# Patient Record
Sex: Male | Born: 1965 | Race: Black or African American | Hispanic: No | Smoking: Never smoker
Health system: Southern US, Community
[De-identification: ages and names within clinical notes are randomized; demographics above are authoritative.]

## PROBLEM LIST (undated history)

## (undated) DIAGNOSIS — Z95810 Presence of automatic (implantable) cardiac defibrillator: Secondary | ICD-10-CM

## (undated) DIAGNOSIS — G43909 Migraine, unspecified, not intractable, without status migrainosus: Secondary | ICD-10-CM

## (undated) DIAGNOSIS — I509 Heart failure, unspecified: Secondary | ICD-10-CM

## (undated) DIAGNOSIS — F32A Depression, unspecified: Secondary | ICD-10-CM

## (undated) DIAGNOSIS — F329 Major depressive disorder, single episode, unspecified: Secondary | ICD-10-CM

## (undated) DIAGNOSIS — F419 Anxiety disorder, unspecified: Secondary | ICD-10-CM

## (undated) DIAGNOSIS — E859 Amyloidosis, unspecified: Secondary | ICD-10-CM

## (undated) HISTORY — DX: Anxiety disorder, unspecified: F41.9

## (undated) HISTORY — DX: Migraine, unspecified, not intractable, without status migrainosus: G43.909

## (undated) HISTORY — DX: Major depressive disorder, single episode, unspecified: F32.9

## (undated) HISTORY — DX: Heart failure, unspecified: I50.9

## (undated) HISTORY — PX: HERNIA REPAIR: SHX51

## (undated) HISTORY — DX: Presence of automatic (implantable) cardiac defibrillator: Z95.810

## (undated) HISTORY — DX: Depression, unspecified: F32.A

## (undated) HISTORY — DX: Amyloidosis, unspecified: E85.9

---

## 2011-11-13 HISTORY — PX: HEART TRANSPLANT: SHX268

## 2011-11-13 HISTORY — PX: LIVER TRANSPLANT: SHX410

## 2013-09-04 ENCOUNTER — Ambulatory Visit (INDEPENDENT_AMBULATORY_CARE_PROVIDER_SITE_OTHER): Payer: BC Managed Care – PPO | Admitting: Family Medicine

## 2013-09-04 ENCOUNTER — Encounter: Payer: Self-pay | Admitting: Family Medicine

## 2013-09-04 VITALS — BP 120/90 | HR 87 | Temp 98.3°F | Resp 16 | Ht 68.0 in | Wt 203.4 lb

## 2013-09-04 DIAGNOSIS — Z9489 Other transplanted organ and tissue status: Secondary | ICD-10-CM | POA: Insufficient documentation

## 2013-09-04 DIAGNOSIS — M5430 Sciatica, unspecified side: Secondary | ICD-10-CM

## 2013-09-04 DIAGNOSIS — R1011 Right upper quadrant pain: Secondary | ICD-10-CM | POA: Insufficient documentation

## 2013-09-04 DIAGNOSIS — F419 Anxiety disorder, unspecified: Secondary | ICD-10-CM

## 2013-09-04 DIAGNOSIS — F411 Generalized anxiety disorder: Secondary | ICD-10-CM

## 2013-09-04 DIAGNOSIS — L910 Hypertrophic scar: Secondary | ICD-10-CM

## 2013-09-04 DIAGNOSIS — G5700 Lesion of sciatic nerve, unspecified lower limb: Secondary | ICD-10-CM

## 2013-09-04 DIAGNOSIS — M5431 Sciatica, right side: Secondary | ICD-10-CM

## 2013-09-04 DIAGNOSIS — G4700 Insomnia, unspecified: Secondary | ICD-10-CM | POA: Insufficient documentation

## 2013-09-04 DIAGNOSIS — G5701 Lesion of sciatic nerve, right lower limb: Secondary | ICD-10-CM

## 2013-09-04 DIAGNOSIS — E859 Amyloidosis, unspecified: Secondary | ICD-10-CM | POA: Insufficient documentation

## 2013-09-04 NOTE — Patient Instructions (Signed)
Piriformis Syndrome Piriformis syndrome is a rare neuromuscular disorder. It happens when the piriformis muscle compresses or irritates the sciatic nerve. This is the largest nerve in the body. The piriformis muscle is a narrow muscle. It is located in the buttocks.  SYMPTOMS  Compression of the sciatic nerve causes pain. It is often described as tingling or numbness:  In the buttocks.  Along the nerve.  Down to the leg. The pain may get worse as a result of:  Sitting for a long period of time.  Climbing stairs.  Walking or running. TREATMENT  Generally, treatment for the disorder begins with stretching exercises and massage. Anti-inflammatory drugs may be prescribed. A patient may be advised to stop running, bicycling, or similar activities. A corticosteroid injection near where the piriformis muscle and the sciatic nerve meet may provide temporary relief. In some cases, surgery is recommended. The outcome for most individuals with this syndrome is good. Once symptoms of the disorder are addressed, individuals can usually resume their normal activities. In some cases, exercise regimens may need to be modified. This is in order to reduce the likelihood of recurrence or worsening. Document Released: 10/19/2002 Document Revised: 04/29/2012 Document Reviewed: 10/29/2005 [facility] Patient Information 2014 [place], [place]. Sciatica with Rehab The sciatic nerve runs from the back down the leg and is responsible for sensation and control of the muscles in the back (posterior) side of the thigh, lower leg, and foot. Sciatica is a condition that is characterized by inflammation of this nerve.  SYMPTOMS   Signs of nerve damage, including numbness and/or weakness along the posterior side of the lower extremity.  Pain in the back of the thigh that may also travel down the leg.  Pain that worsens when sitting for long periods of time.  Occasionally, pain in the back or buttock. CAUSES    Inflammation of the sciatic nerve is the cause of sciatica. The inflammation is due to something irritating the nerve. Common sources of irritation include:  Sitting for long periods of time.  Direct trauma to the nerve.  Arthritis of the spine.  Herniated or ruptured disk.  Slipping of the vertebrae (spondylolithesis)  Pressure from soft tissues, such as muscles or ligament-like tissue (fascia). RISK INCREASES WITH:  Sports that place pressure or stress on the spine (football or weightlifting).  Poor strength and flexibility.  Failure to warm-up properly before activity.  Family history of low back pain or disk disorders.  Previous back injury or surgery.  Poor body mechanics, especially when lifting, or poor posture. PREVENTION   Warm up and stretch properly before activity.  Maintain physical fitness:  Strength, flexibility, and endurance.  Cardiovascular fitness.  Learn and use proper technique, especially with posture and lifting. When possible, have coach correct improper technique.  Avoid activities that place stress on the spine. PROGNOSIS If treated properly, then sciatica usually resolves within 6 weeks. However, occasionally surgery is necessary.  RELATED COMPLICATIONS   Permanent nerve damage, including pain, numbness, tingle, or weakness.  Chronic back pain.  Risks of surgery: infection, bleeding, nerve damage, or damage to surrounding tissues. TREATMENT Treatment initially involves resting from any activities that aggravate your symptoms. The use of ice and medication may help reduce pain and inflammation. The use of strengthening and stretching exercises may help reduce pain with activity. These exercises may be performed at home or with referral to a therapist. A therapist may recommend further treatments, such as transcutaneous electronic nerve stimulation (TENS) or ultrasound. Your caregiver may recommend  corticosteroid injections to help reduce  inflammation of the sciatic nerve. If symptoms persist despite non-surgical (conservative) treatment, then surgery may be recommended. MEDICATION  If pain medication is necessary, then nonsteroidal anti-inflammatory medications, such as aspirin and ibuprofen, or other minor pain relievers, such as acetaminophen, are often recommended.  Do not take pain medication for 7 days before surgery.  Prescription pain relievers may be given if deemed necessary by your caregiver. Use only as directed and only as much as you need.  Ointments applied to the skin may be helpful.  Corticosteroid injections may be given by your caregiver. These injections should be reserved for the most serious cases, because they may only be given a certain number of times. HEAT AND COLD  Cold treatment (icing) relieves pain and reduces inflammation. Cold treatment should be applied for 10 to 15 minutes every 2 to 3 hours for inflammation and pain and immediately after any activity that aggravates your symptoms. Use ice packs or massage the area with a piece of ice (ice massage).  Heat treatment may be used prior to performing the stretching and strengthening activities prescribed by your caregiver, physical therapist, or athletic trainer. Use a heat pack or soak the injury in warm water. SEEK MEDICAL CARE IF:  Treatment seems to offer no benefit, or the condition worsens.  Any medications produce adverse side effects. EXERCISES  RANGE OF MOTION (ROM) AND STRETCHING EXERCISES - Sciatica Most people with sciatic will find that their symptoms worsen with either excessive bending forward (flexion) or arching at the low back (extension). The exercises which will help resolve your symptoms will focus on the opposite motion. Your physician, physical therapist or athletic trainer will help you determine which exercises will be most helpful to resolve your low back pain. Do not complete any exercises without first consulting with  your clinician. Discontinue any exercises which worsen your symptoms until you speak to your clinician. If you have pain, numbness or tingling which travels down into your buttocks, leg or foot, the goal of the therapy is for these symptoms to move closer to your back and eventually resolve. Occasionally, these leg symptoms will get better, but your low back pain may worsen; this is typically an indication of progress in your rehabilitation. Be certain to be very alert to any changes in your symptoms and the activities in which you participated in the 24 hours prior to the change. Sharing this information with your clinician will allow him/her to most efficiently treat your condition. These exercises may help you when beginning to rehabilitate your injury. Your symptoms may resolve with or without further involvement from your physician, physical therapist or athletic trainer. While completing these exercises, remember:   Restoring tissue flexibility helps normal motion to return to the joints. This allows healthier, less painful movement and activity.  An effective stretch should be held for at least 30 seconds.  A stretch should never be painful. You should only feel a gentle lengthening or release in the stretched tissue. FLEXION RANGE OF MOTION AND STRETCHING EXERCISES: STRETCH  Flexion, Single Knee to Chest   Lie on a firm bed or floor with both legs extended in front of you.  Keeping one leg in contact with the floor, bring your opposite knee to your chest. Hold your leg in place by either grabbing behind your thigh or at your knee.  Pull until you feel a gentle stretch in your low back. Hold __________ seconds.  Slowly release your grasp and repeat  the exercise with the opposite side. Repeat __________ times. Complete this exercise __________ times per day.  STRETCH  Flexion, Double Knee to Chest  Lie on a firm bed or floor with both legs extended in front of you.  Keeping one leg in  contact with the floor, bring your opposite knee to your chest.  Tense your stomach muscles to support your back and then lift your other knee to your chest. Hold your legs in place by either grabbing behind your thighs or at your knees.  Pull both knees toward your chest until you feel a gentle stretch in your low back. Hold __________ seconds.  Tense your stomach muscles and slowly return one leg at a time to the floor. Repeat __________ times. Complete this exercise __________ times per day.  STRETCH  Low Trunk Rotation   Lie on a firm bed or floor. Keeping your legs in front of you, bend your knees so they are both pointed toward the ceiling and your feet are flat on the floor.  Extend your arms out to the side. This will stabilize your upper body by keeping your shoulders in contact with the floor.  Gently and slowly drop both knees together to one side until you feel a gentle stretch in your low back. Hold for __________ seconds.  Tense your stomach muscles to support your low back as you bring your knees back to the starting position. Repeat the exercise to the other side. Repeat __________ times. Complete this exercise __________ times per day  EXTENSION RANGE OF MOTION AND FLEXIBILITY EXERCISES: STRETCH  Extension, Prone on Elbows  Lie on your stomach on the floor, a bed will be too soft. Place your palms about shoulder width apart and at the height of your head.  Place your elbows under your shoulders. If this is too painful, stack pillows under your chest.  Allow your body to relax so that your hips drop lower and make contact more completely with the floor.  Hold this position for __________ seconds.  Slowly return to lying flat on the floor. Repeat __________ times. Complete this exercise __________ times per day.  RANGE OF MOTION  Extension, Prone Press Ups  Lie on your stomach on the floor, a bed will be too soft. Place your palms about shoulder width apart and at the  height of your head.  Keeping your back as relaxed as possible, slowly straighten your elbows while keeping your hips on the floor. You may adjust the placement of your hands to maximize your comfort. As you gain motion, your hands will come more underneath your shoulders.  Hold this position __________ seconds.  Slowly return to lying flat on the floor. Repeat __________ times. Complete this exercise __________ times per day.  STRENGTHENING EXERCISES - Sciatica  These exercises may help you when beginning to rehabilitate your injury. These exercises should be done near your "sweet spot." This is the neutral, low-back arch, somewhere between fully rounded and fully arched, that is your least painful position. When performed in this safe range of motion, these exercises can be used for people who have either a flexion or extension based injury. These exercises may resolve your symptoms with or without further involvement from your physician, physical therapist or athletic trainer. While completing these exercises, remember:   Muscles can gain both the endurance and the strength needed for everyday activities through controlled exercises.  Complete these exercises as instructed by your physician, physical therapist or athletic trainer. Progress with  the resistance and repetition exercises only as your caregiver advises.  You may experience muscle soreness or fatigue, but the pain or discomfort you are trying to eliminate should never worsen during these exercises. If this pain does worsen, stop and make certain you are following the directions exactly. If the pain is still present after adjustments, discontinue the exercise until you can discuss the trouble with your clinician. STRENGTHENING Deep Abdominals, Pelvic Tilt   Lie on a firm bed or floor. Keeping your legs in front of you, bend your knees so they are both pointed toward the ceiling and your feet are flat on the floor.  Tense your lower  abdominal muscles to press your low back into the floor. This motion will rotate your pelvis so that your tail bone is scooping upwards rather than pointing at your feet or into the floor.  With a gentle tension and even breathing, hold this position for __________ seconds. Repeat __________ times. Complete this exercise __________ times per day.  STRENGTHENING  Abdominals, Crunches   Lie on a firm bed or floor. Keeping your legs in front of you, bend your knees so they are both pointed toward the ceiling and your feet are flat on the floor. Cross your arms over your chest.  Slightly tip your chin down without bending your neck.  Tense your abdominals and slowly lift your trunk high enough to just clear your shoulder blades. Lifting higher can put excessive stress on the low back and does not further strengthen your abdominal muscles.  Control your return to the starting position. Repeat __________ times. Complete this exercise __________ times per day.  STRENGTHENING  Quadruped, Opposite UE/LE Lift  Assume a hands and knees position on a firm surface. Keep your hands under your shoulders and your knees under your hips. You may place padding under your knees for comfort.  Find your neutral spine and gently tense your abdominal muscles so that you can maintain this position. Your shoulders and hips should form a rectangle that is parallel with the floor and is not twisted.  Keeping your trunk steady, lift your right hand no higher than your shoulder and then your left leg no higher than your hip. Make sure you are not holding your breath. Hold this position __________ seconds.  Continuing to keep your abdominal muscles tense and your back steady, slowly return to your starting position. Repeat with the opposite arm and leg. Repeat __________ times. Complete this exercise __________ times per day.  STRENGTHENING  Abdominals and Quadriceps, Straight Leg Raise   Lie on a firm bed or floor with  both legs extended in front of you.  Keeping one leg in contact with the floor, bend the other knee so that your foot can rest flat on the floor.  Find your neutral spine, and tense your abdominal muscles to maintain your spinal position throughout the exercise.  Slowly lift your straight leg off the floor about 6 inches for a count of 15, making sure to not hold your breath.  Still keeping your neutral spine, slowly lower your leg all the way to the floor. Repeat this exercise with each leg __________ times. Complete this exercise __________ times per day. POSTURE AND BODY MECHANICS CONSIDERATIONS - Sciatica Keeping correct posture when sitting, standing or completing your activities will reduce the stress put on different body tissues, allowing injured tissues a chance to heal and limiting painful experiences. The following are general guidelines for improved posture. Your physician or physical  therapist will provide you with any instructions specific to your needs. While reading these guidelines, remember:  The exercises prescribed by your provider will help you have the flexibility and strength to maintain correct postures.  The correct posture provides the optimal environment for your joints to work. All of your joints have less wear and tear when properly supported by a spine with good posture. This means you will experience a healthier, less painful body.  Correct posture must be practiced with all of your activities, especially prolonged sitting and standing. Correct posture is as important when doing repetitive low-stress activities (typing) as it is when doing a single heavy-load activity (lifting). RESTING POSITIONS Consider which positions are most painful for you when choosing a resting position. If you have pain with flexion-based activities (sitting, bending, stooping, squatting), choose a position that allows you to rest in a less flexed posture. You would want to avoid curling  into a fetal position on your side. If your pain worsens with extension-based activities (prolonged standing, working overhead), avoid resting in an extended position such as sleeping on your stomach. Most people will find more comfort when they rest with their spine in a more neutral position, neither too rounded nor too arched. Lying on a non-sagging bed on your side with a pillow between your knees, or on your back with a pillow under your knees will often provide some relief. Keep in mind, being in any one position for a prolonged period of time, no matter how correct your posture, can still lead to stiffness. PROPER SITTING POSTURE In order to minimize stress and discomfort on your spine, you must sit with correct posture Sitting with good posture should be effortless for a healthy body. Returning to good posture is a gradual process. Many people can work toward this most comfortably by using various supports until they have the flexibility and strength to maintain this posture on their own. When sitting with proper posture, your ears will fall over your shoulders and your shoulders will fall over your hips. You should use the back of the chair to support your upper back. Your low back will be in a neutral position, just slightly arched. You may place a small pillow or folded towel at the base of your low back for support.  When working at a desk, create an environment that supports good, upright posture. Without extra support, muscles fatigue and lead to excessive strain on joints and other tissues. Keep these recommendations in mind: CHAIR:   A chair should be able to slide under your desk when your back makes contact with the back of the chair. This allows you to work closely.  The chair's height should allow your eyes to be level with the upper part of your monitor and your hands to be slightly lower than your elbows. BODY POSITION  Your feet should make contact with the floor. If this is not  possible, use a foot rest.  Keep your ears over your shoulders. This will reduce stress on your neck and low back. INCORRECT SITTING POSTURES   If you are feeling tired and unable to assume a healthy sitting posture, do not slouch or slump. This puts excessive strain on your back tissues, causing more damage and pain. Healthier options include:  Using more support, like a lumbar pillow.  Switching tasks to something that requires you to be upright or walking.  Talking a brief walk.  Lying down to rest in a neutral-spine position. PROLONGED STANDING WHILE  SLIGHTLY LEANING FORWARD  When completing a task that requires you to lean forward while standing in one place for a long time, place either foot up on a stationary 2-4 inch high object to help maintain the best posture. When both feet are on the ground, the low back tends to lose its slight inward curve. If this curve flattens (or becomes too large), then the back and your other joints will experience too much stress, fatigue more quickly and can cause pain.  CORRECT STANDING POSTURES Proper standing posture should be assumed with all daily activities, even if they only take a few moments, like when brushing your teeth. As in sitting, your ears should fall over your shoulders and your shoulders should fall over your hips. You should keep a slight tension in your abdominal muscles to brace your spine. Your tailbone should point down to the ground, not behind your body, resulting in an over-extended swayback posture.  INCORRECT STANDING POSTURES  Common incorrect standing postures include a forward head, locked knees and/or an excessive swayback. WALKING Walk with an upright posture. Your ears, shoulders and hips should all line-up. PROLONGED ACTIVITY IN A FLEXED POSITION When completing a task that requires you to bend forward at your waist or lean over a low surface, try to find a way to stabilize 3 of 4 of your limbs. You can place a hand or  elbow on your thigh or rest a knee on the surface you are reaching across. This will provide you more stability so that your muscles do not fatigue as quickly. By keeping your knees relaxed, or slightly bent, you will also reduce stress across your low back. CORRECT LIFTING TECHNIQUES DO :   Assume a wide stance. This will provide you more stability and the opportunity to get as close as possible to the object which you are lifting.  Tense your abdominals to brace your spine; then bend at the knees and hips. Keeping your back locked in a neutral-spine position, lift using your leg muscles. Lift with your legs, keeping your back straight.  Test the weight of unknown objects before attempting to lift them.  Try to keep your elbows locked down at your sides in order get the best strength from your shoulders when carrying an object.  Always ask for help when lifting heavy or awkward objects. INCORRECT LIFTING TECHNIQUES DO NOT:   Lock your knees when lifting, even if it is a small object.  Bend and twist. Pivot at your feet or move your feet when needing to change directions.  Assume that you cannot safely pick up a paperclip without proper posture. Document Released: 10/29/2005 Document Revised: 01/21/2012 Document Reviewed: 02/10/2009 [facility] Patient Information 2014 [place], [place].

## 2013-09-04 NOTE — Progress Notes (Signed)
Subjective:    Patient ID: Jason Ware, male    DOB: [birth year], 47 y.o.   MRN: [id number] Chief Complaint  Patient presents with  . Here to establish care    HPI  Jason Ware is a delightful 47 yo here to establish care.  He has had a very traumatic past few years. Never had any medical problems - was always very fit and healthy - big into jogging/exercising.  His mother and all of his uncles have amyloidosis and has mother passed away from heart failure with this. Sev yr ago, Jason Ware was working near [place] and noticed that he had a hard time jogging and exercise tolerance went down.  Then he got a cold which lasted for a month so went to the doctor who thought that his cardiac exam sounded abnml so a w/u was begun resulting in him being diagnosed w/ amyloidosis as well.  During his hospitalization to have an ICD placed, he was then told that he would need a double heart and liver transplant.  Had the transplant last Sept 2013 - so is 13 mos out now.  He has apparently been doing fairly well physically since then. His main issue seems to be low wbc count which has required a trial of a few dif meds - his wbc has been low and so his med has been switched.   His main challenges have been mental/subconscious. He has become fairly anxious about his health. When he had the ICD previously it did fire several times when he was exercising and so now he finds himself just mentally unable to push himself physically - to make him exert himself. The ICD is no longer connected since his heart transplant and he is going to PT regularly (currently seeing Robert at [facility]) but he still always backs off before he gets to intense physically due to subconscious concerns about his new heart.    He is no longer able to work since his surgeries and he owned some property here in [place] so he has moved here to try to fix up the house. Unfortunately, his wife could not find work here so she is living in [place] with their 74  yo daughter in an appt . She is working as a Clinical biochemist.  They often visit on the weekends but can't come this weekend because his daughter is feeling ill so he can't be exposed to her. He just recently started driving again.  He plans to keep all of his transplant team and specialist physicians in [place], [place] but they told him to just establish w/ a PCP here. His mother-in-law still lives in [place], [place] so whenever he has appts or tests he spends the night with her. He has a bone density test sched in the next sev mos.  He also has seen as psychiatrist there - Dr. Asencion Gowda who has him on citalopram 30mg  qd - last saw him about 2 mos prev.  Unsure if it is helping. Does have a hard time sleeping - often gets anxious at night when he is alone. Will wake freq but this is gradually improving. Has never really tried any medications before.  Feels like vision is getting a little weaker.   Skin more Sensiv to sunlight - feels tingling.  Feels a lot of fullness/pressure/cramping on his RUQ.  Is developing a little keloid in the center of his chest which bothers him.  Is having some pain in his right buttock that radiates all the  way down the back of his leg to his foot - is sharp shooting burning and sometimes numb - goes away with rest but will often start up w/ walking.  Past Medical History  Diagnosis Date  . CHF (congestive heart failure)   . Depression   . Anxiety   . ICD (implantable cardioverter-defibrillator) in place   . Amyloidosis    Past Surgical History  Procedure Laterality Date  . Heart transplant    . Liver transplant     No current outpatient prescriptions on file prior to visit.   No current facility-administered medications on file prior to visit.   No Known Allergies Family History  Problem Relation Age of Onset  . Heart disease Mother    History   Social History  . Marital Status: Married    Spouse Name: N/A    Number of Children: N/A  . Years of  Education: college   Social History Main Topics  . Smoking status: Unknown If Ever Smoked  . Smokeless tobacco: Not on file  . Alcohol Use: No  . Drug Use: No  . Sexual Activity: Not on file   Other Topics Concern  . Not on file   Social History Narrative   Exercise: No.     Review of Systems  Constitutional: Positive for activity change and fatigue. Negative for fever, chills, diaphoresis, appetite change and unexpected weight change.  Eyes: Positive for visual disturbance. Negative for photophobia.  Respiratory: Negative for chest tightness and shortness of breath.   Cardiovascular: Negative for chest pain.  Gastrointestinal: Positive for abdominal distention. Negative for abdominal pain.  Musculoskeletal: Positive for gait problem and myalgias. Negative for back pain and joint swelling.  Skin: Negative for color change and rash.       Keloid bothering him Sun-sensitivity - causes tingling  Allergic/Immunologic: Positive for immunocompromised state.  Neurological: Negative for weakness and numbness.  Psychiatric/Behavioral: Positive for sleep disturbance and dysphoric mood. Negative for behavioral problems, confusion, decreased concentration and agitation. The patient is nervous/anxious.       BP 120/90  Pulse 87  Temp(Src) 98.3 F (36.8 C) (Oral)  Resp 16  Ht 5\' 8"  (1.727 m)  Wt 203 lb 6.4 oz (92.262 kg)  BMI 30.93 kg/m2  SpO2 96% Objective:   Physical Exam  Constitutional: He is oriented to person, place, and time. He appears well-developed and well-nourished. No distress.  HENT:  Head: Normocephalic and atraumatic.  Eyes: Conjunctivae are normal. Pupils are equal, round, and reactive to light. No scleral icterus.  Neck: Normal range of motion. Neck supple. No thyromegaly present.  Cardiovascular: Normal rate, regular rhythm, normal heart sounds and intact distal pulses.   Pulmonary/Chest: Effort normal and breath sounds normal. No respiratory distress.   Musculoskeletal: He exhibits no edema.       Right hip: He exhibits decreased range of motion.       Left hip: Normal.  Pain with movement of Rt hip. + straight leg raise on right.  Lymphadenopathy:    He has no cervical adenopathy.  Neurological: He is alert and oriented to person, place, and time. Gait normal.  Reflex Scores:      Patellar reflexes are 2+ on the right side and 2+ on the left side.      Achilles reflexes are 2+ on the right side and 2+ on the left side. Skin: Skin is warm and dry. He is not diaphoretic.  Psychiatric: He has a normal mood and affect. His behavior  is normal.      Assessment & Plan:   Piriformis syndrome of right side  Sciatica neuralgia, right - gave exercises. Work w/ Molly Maduro at [facility] on this.  Abdominal discomfort in right upper quadrant - consider referral for myofascial release - likely from scar tissue if continues  Keloid scar of skin - if worsening, we could try intralesional steroid inj in office  Insomnia - consider trial of short-term low dose benzo if persists.  Anxiety  Amyloidosis - get records and cont to f/u w/ specialists at [facility]  Transplant recipient - get records and cont to f/u with [facility]  Meds ordered this encounter  Medications  . atorvastatin (LIPITOR) 10 MG tablet    Sig: Take 10 mg by mouth daily.  . citalopram (CELEXA) 20 MG tablet    Sig: Take 20 mg by mouth daily.  . cycloSPORINE (SANDIMMUNE) 25 MG capsule    Sig: Take 25 mg by mouth 2 (two) times daily.  . cycloSPORINE (SANDIMMUNE) 100 MG capsule    Sig: Take 100 mg by mouth 2 (two) times daily.  Marland Kitchen OVER THE COUNTER MEDICATION    Sig: OTC Calcium-vitamin D 600 mg-intl units oral capsule taking twice daily  . diltiazem (DILACOR XR) 180 MG 24 hr capsule    Sig: Take 180 mg by mouth daily.  Marland Kitchen OVER THE COUNTER MEDICATION    Sig: Folic acid 1 mg oral tablet taking 1 tablet daily  . OVER THE COUNTER MEDICATION    Sig: Magnesium oxide 400 mg oral tablet 2  tablets daily  . PRESCRIPTION MEDICATION    Sig: Mycophenolate mofetil 500 mg oral tablets every 12 hours  . PRESCRIPTION MEDICATION    Sig: Pantoprazole 40 mg oral delayed release tablet take one by mouth daily   [name], MD MPH

## 2013-10-16 ENCOUNTER — Encounter: Payer: Self-pay | Admitting: Family Medicine

## 2013-10-16 ENCOUNTER — Ambulatory Visit (INDEPENDENT_AMBULATORY_CARE_PROVIDER_SITE_OTHER): Payer: BC Managed Care – PPO | Admitting: Family Medicine

## 2013-10-16 VITALS — BP 120/90 | HR 97 | Temp 98.7°F | Resp 16 | Ht 68.5 in | Wt 210.6 lb

## 2013-10-16 DIAGNOSIS — G5700 Lesion of sciatic nerve, unspecified lower limb: Secondary | ICD-10-CM

## 2013-10-16 DIAGNOSIS — G5701 Lesion of sciatic nerve, right lower limb: Secondary | ICD-10-CM

## 2013-10-16 DIAGNOSIS — M5431 Sciatica, right side: Secondary | ICD-10-CM

## 2013-10-16 DIAGNOSIS — M5430 Sciatica, unspecified side: Secondary | ICD-10-CM

## 2013-10-16 MED ORDER — METHOCARBAMOL 500 MG PO TABS: 1000.0000 mg | ORAL_TABLET | Freq: Three times a day (TID) | ORAL | Status: DC | PRN

## 2013-10-16 NOTE — Progress Notes (Signed)
Subjective:    Patient ID: Jason Ware, male    DOB: 1966-02-28, 46 y.o.   MRN: [id number] Chief Complaint  Patient presents with  . Back Pain    no improvement    HPI  Still having a lot of pain from sciatica.  [facility]  Did start after his surgeries from transplant.  Certain PT exercises really flair it up.  It often flairs up over night.  Otherwise doing well.  Went to [place] sev wks ago to follow up this his transplant drs there and everything looks good.  Past Medical History  Diagnosis Date  . CHF (congestive heart failure)   . Depression   . Anxiety   . ICD (implantable cardioverter-defibrillator) in place   . Amyloidosis    Current Outpatient Prescriptions on File Prior to Visit  Medication Sig Dispense Refill  . atorvastatin (LIPITOR) 10 MG tablet Take 10 mg by mouth daily.      . citalopram (CELEXA) 20 MG tablet Take 20 mg by mouth daily.      . cycloSPORINE (SANDIMMUNE) 100 MG capsule Take 100 mg by mouth 2 (two) times daily.      . cycloSPORINE (SANDIMMUNE) 25 MG capsule Take 25 mg by mouth 2 (two) times daily.      Marland Kitchen diltiazem (DILACOR XR) 180 MG 24 hr capsule Take 180 mg by mouth daily.      Marland Kitchen OVER THE COUNTER MEDICATION OTC Calcium-vitamin D 600 mg-intl units oral capsule taking twice daily      . OVER THE COUNTER MEDICATION Folic acid 1 mg oral tablet taking 1 tablet daily      . OVER THE COUNTER MEDICATION Magnesium oxide 400 mg oral tablet 2 tablets daily      . PRESCRIPTION MEDICATION Mycophenolate mofetil 500 mg oral tablets every 12 hours      . PRESCRIPTION MEDICATION Pantoprazole 40 mg oral delayed release tablet take one by mouth daily       No current facility-administered medications on file prior to visit.   No Known Allergies   Review of Systems  Constitutional: Positive for fatigue. Negative for fever, chills, diaphoresis, activity change, appetite change and unexpected weight change.  Gastrointestinal: Positive for  abdominal pain and abdominal distention. Negative for nausea, vomiting, diarrhea and constipation.  Musculoskeletal: Positive for arthralgias, back pain and myalgias. Negative for gait problem, joint swelling, neck pain and neck stiffness.  Skin: Negative for color change, rash and wound.  Neurological: Positive for weakness. Negative for numbness.  Hematological: Negative for adenopathy. Does not bruise/bleed easily.  Psychiatric/Behavioral: Positive for sleep disturbance.      BP 120/90  Pulse 97  Temp(Src) 98.7 F (37.1 C) (Oral)  Resp 16  Ht 5' 8.5" (1.74 m)  Wt 210 lb 9.6 oz (95.528 kg)  BMI 31.55 kg/m2  SpO2 95% Objective:   Physical Exam  Constitutional: He is oriented to person, place, and time. He appears well-developed and well-nourished. No distress.  HENT:  Head: Normocephalic and atraumatic.  Eyes: Conjunctivae are normal. Pupils are equal, round, and reactive to light. No scleral icterus.  Neck: Normal range of motion. Neck supple. No thyromegaly present.  Cardiovascular: Normal rate, regular rhythm, normal heart sounds and intact distal pulses.   Pulmonary/Chest: Effort normal and breath sounds normal. No respiratory distress.  Musculoskeletal: He exhibits no edema.  Lymphadenopathy:    He has no cervical adenopathy.  Neurological: He is alert and oriented to person, place, and time.  Skin: Skin is warm and dry. He is not diaphoretic.  Psychiatric: He has a normal mood and affect. His behavior is normal.          Assessment & Plan:   Pyriformis syndrome, right - Plan: Ambulatory referral to Physical Therapy - traditional PT is not helping pt quite as much. He really does not want to take medications so will try referral to [facility] for more myofascial release techniques and poss deep tissue massage which will hopefully also help with his pain from scar tissue/abd distention.  Pt encouraged to try robaxin - unlikely to have sig fatigue or  sedation.  Sciatica, right - Plan: Ambulatory referral to Physical Therapy  Meds ordered this encounter  Medications  . methocarbamol (ROBAXIN) 500 MG tablet    Sig: Take 2 tablets (1,000 mg total) by mouth every 8 (eight) hours as needed for muscle spasms.    Dispense:  90 tablet    Refill:  1   [name], MD MPH

## 2015-08-01 LAB — HM HEPATITIS C SCREENING LAB: HM Hepatitis Screen: NEGATIVE

## 2016-01-05 DIAGNOSIS — Z944 Liver transplant status: Secondary | ICD-10-CM | POA: Diagnosis not present

## 2016-01-05 DIAGNOSIS — N183 Chronic kidney disease, stage 3 (moderate): Secondary | ICD-10-CM | POA: Diagnosis not present

## 2016-01-05 DIAGNOSIS — Z7982 Long term (current) use of aspirin: Secondary | ICD-10-CM | POA: Diagnosis not present

## 2016-01-05 DIAGNOSIS — E785 Hyperlipidemia, unspecified: Secondary | ICD-10-CM | POA: Diagnosis not present

## 2016-01-05 DIAGNOSIS — R51 Headache: Secondary | ICD-10-CM | POA: Diagnosis not present

## 2016-01-05 DIAGNOSIS — Z941 Heart transplant status: Secondary | ICD-10-CM | POA: Diagnosis not present

## 2016-01-05 DIAGNOSIS — Z4821 Encounter for aftercare following heart transplant: Secondary | ICD-10-CM | POA: Diagnosis not present

## 2016-01-05 DIAGNOSIS — I129 Hypertensive chronic kidney disease with stage 1 through stage 4 chronic kidney disease, or unspecified chronic kidney disease: Secondary | ICD-10-CM | POA: Diagnosis not present

## 2016-01-05 DIAGNOSIS — E859 Amyloidosis, unspecified: Secondary | ICD-10-CM | POA: Diagnosis not present

## 2016-01-05 DIAGNOSIS — Z01810 Encounter for preprocedural cardiovascular examination: Secondary | ICD-10-CM | POA: Diagnosis not present

## 2016-01-05 DIAGNOSIS — I25119 Atherosclerotic heart disease of native coronary artery with unspecified angina pectoris: Secondary | ICD-10-CM | POA: Diagnosis not present

## 2016-01-31 DIAGNOSIS — E785 Hyperlipidemia, unspecified: Secondary | ICD-10-CM | POA: Diagnosis not present

## 2016-01-31 DIAGNOSIS — Z941 Heart transplant status: Secondary | ICD-10-CM | POA: Diagnosis not present

## 2016-01-31 DIAGNOSIS — I10 Essential (primary) hypertension: Secondary | ICD-10-CM | POA: Diagnosis not present

## 2016-01-31 DIAGNOSIS — Z944 Liver transplant status: Secondary | ICD-10-CM | POA: Diagnosis not present

## 2016-05-01 DIAGNOSIS — Z95810 Presence of automatic (implantable) cardiac defibrillator: Secondary | ICD-10-CM | POA: Diagnosis not present

## 2016-05-01 DIAGNOSIS — I10 Essential (primary) hypertension: Secondary | ICD-10-CM | POA: Diagnosis not present

## 2016-05-01 DIAGNOSIS — R002 Palpitations: Secondary | ICD-10-CM | POA: Diagnosis not present

## 2016-05-01 DIAGNOSIS — Z941 Heart transplant status: Secondary | ICD-10-CM | POA: Diagnosis not present

## 2016-05-01 DIAGNOSIS — R918 Other nonspecific abnormal finding of lung field: Secondary | ICD-10-CM | POA: Diagnosis not present

## 2016-05-01 DIAGNOSIS — Z944 Liver transplant status: Secondary | ICD-10-CM | POA: Diagnosis not present

## 2016-05-01 DIAGNOSIS — E785 Hyperlipidemia, unspecified: Secondary | ICD-10-CM | POA: Diagnosis not present

## 2016-06-30 DIAGNOSIS — Z23 Encounter for immunization: Secondary | ICD-10-CM | POA: Diagnosis not present

## 2016-07-31 DIAGNOSIS — I10 Essential (primary) hypertension: Secondary | ICD-10-CM | POA: Diagnosis not present

## 2016-07-31 DIAGNOSIS — R7989 Other specified abnormal findings of blood chemistry: Secondary | ICD-10-CM | POA: Diagnosis not present

## 2016-07-31 DIAGNOSIS — E785 Hyperlipidemia, unspecified: Secondary | ICD-10-CM | POA: Diagnosis not present

## 2016-07-31 DIAGNOSIS — Z944 Liver transplant status: Secondary | ICD-10-CM | POA: Diagnosis not present

## 2016-07-31 DIAGNOSIS — Z0389 Encounter for observation for other suspected diseases and conditions ruled out: Secondary | ICD-10-CM | POA: Diagnosis not present

## 2016-07-31 DIAGNOSIS — F419 Anxiety disorder, unspecified: Secondary | ICD-10-CM | POA: Diagnosis not present

## 2016-07-31 DIAGNOSIS — Z941 Heart transplant status: Secondary | ICD-10-CM | POA: Diagnosis not present

## 2016-08-12 ENCOUNTER — Emergency Department (HOSPITAL_COMMUNITY): Payer: Medicare Other

## 2016-08-12 ENCOUNTER — Encounter (HOSPITAL_COMMUNITY): Payer: Self-pay | Admitting: Emergency Medicine

## 2016-08-12 ENCOUNTER — Emergency Department (HOSPITAL_COMMUNITY)
Admission: EM | Admit: 2016-08-12 | Discharge: 2016-08-12 | Disposition: A | Discharge status: Home / Self Care | Payer: Medicare Other | Attending: Emergency Medicine | Admitting: Emergency Medicine

## 2016-08-12 DIAGNOSIS — R079 Chest pain, unspecified: Secondary | ICD-10-CM | POA: Diagnosis not present

## 2016-08-12 DIAGNOSIS — Z941 Heart transplant status: Secondary | ICD-10-CM | POA: Diagnosis not present

## 2016-08-12 DIAGNOSIS — I509 Heart failure, unspecified: Secondary | ICD-10-CM | POA: Diagnosis not present

## 2016-08-12 DIAGNOSIS — R0789 Other chest pain: Secondary | ICD-10-CM | POA: Diagnosis not present

## 2016-08-12 DIAGNOSIS — Z95810 Presence of automatic (implantable) cardiac defibrillator: Secondary | ICD-10-CM | POA: Diagnosis not present

## 2016-08-12 LAB — COMPREHENSIVE METABOLIC PANEL WITH GFR
ALT: 13 U/L — ABNORMAL LOW (ref 17–63)
AST: 21 U/L (ref 15–41)
Albumin: 4.5 g/dL (ref 3.5–5.0)
Alkaline Phosphatase: 79 U/L (ref 38–126)
Anion gap: 8 (ref 5–15)
BUN: 23 mg/dL — ABNORMAL HIGH (ref 6–20)
CO2: 25 mmol/L (ref 22–32)
Calcium: 9.6 mg/dL (ref 8.9–10.3)
Chloride: 106 mmol/L (ref 101–111)
Creatinine, Ser: 1.76 mg/dL — ABNORMAL HIGH (ref 0.61–1.24)
GFR calc Af Amer: 51 mL/min — ABNORMAL LOW
GFR calc non Af Amer: 44 mL/min — ABNORMAL LOW
Glucose, Bld: 91 mg/dL (ref 65–99)
Potassium: 4.6 mmol/L (ref 3.5–5.1)
Sodium: 139 mmol/L (ref 135–145)
Total Bilirubin: 1 mg/dL (ref 0.3–1.2)
Total Protein: 7 g/dL (ref 6.5–8.1)

## 2016-08-12 LAB — CBC
HCT: 39.1 % (ref 39.0–52.0)
Hemoglobin: 12.8 g/dL — ABNORMAL LOW (ref 13.0–17.0)
MCH: 27.9 pg (ref 26.0–34.0)
MCHC: 32.7 g/dL (ref 30.0–36.0)
MCV: 85.4 fL (ref 78.0–100.0)
Platelets: 254 K/uL (ref 150–400)
RBC: 4.58 MIL/uL (ref 4.22–5.81)
RDW: 13.6 % (ref 11.5–15.5)
WBC: 4.8 K/uL (ref 4.0–10.5)

## 2016-08-12 LAB — D-DIMER, QUANTITATIVE: D-Dimer, Quant: <0.27 ug{FEU}/mL (ref 0.00–0.50)

## 2016-08-12 LAB — I-STAT TROPONIN, ED
Troponin i, poc: 0.01 ng/mL (ref 0.00–0.08)
Troponin i, poc: 0 ng/mL (ref 0.00–0.08)

## 2016-08-12 NOTE — ED Provider Notes (Signed)
[facility]-EMERGENCY DEPT Provider Note   CSN: [id number] Arrival date & time: 08/12/16  7829     History   Chief Complaint Chief Complaint  Patient presents with  . Chest Pain  . Heart Transplant    HPI Jason Ware is a 50 y.o. male with a past medical history of amyloidosis status post cardiac and liver transplant 4 years ago who presents today complaining of chest pain. Patient states that he has been having intermittent, left-sided sharp chest pains 2 months. Patient states that he saw his transplant doctor 3 weeks ago and mentioned the symptoms. He states he performed an EKG at that time which was unremarkable. This morning around 4 AM patient states that he felt 3 individual sharp pains in the center of his chest. Patient subsequently developed chest tightness that bleed into his left jaw and to his left tricep. The symptoms lasted briefly and then patient developed nausea without vomiting. Patient reports associated dizziness but no diaphoresis, syncope, paresthesias or shortness of breath. Patient also reports increasing anxiety and itchiness along his bilateral hands and neck and back.   Of note, patient's cardiac and liver transplant was performed [facility] in [place]. Patient is unable to recall the name of his transplant decision. Patient states that he only had one complication from his surgery one year ago when he was told he had some minimal retraction. Patient states was hospitalized at that time for one week, recovered and has not had any other ongoing problems from his transplant.  HPI  Past Medical History:  Diagnosis Date  . Amyloidosis (HCC)   . Anxiety   . CHF (congestive heart failure) (HCC)   . Depression   . ICD (implantable cardioverter-defibrillator) in place     Patient Active Problem List   Diagnosis Date Noted  . Amyloidosis (HCC) 09/04/2013  . Anxiety 09/04/2013  . Insomnia 09/04/2013  . Keloid scar of skin 09/04/2013  . Abdominal  discomfort in right upper quadrant 09/04/2013  . Transplant recipient 09/04/2013    Past Surgical History:  Procedure Laterality Date  . HEART TRANSPLANT    . LIVER TRANSPLANT         Home Medications    Prior to Admission medications   Medication Sig Start Date End Date Taking? Authorizing Provider  atorvastatin (LIPITOR) 10 MG tablet Take 10 mg by mouth daily.    Historical Provider, MD  citalopram (CELEXA) 20 MG tablet Take 20 mg by mouth daily.    Historical Provider, MD  cycloSPORINE (SANDIMMUNE) 100 MG capsule Take 100 mg by mouth 2 (two) times daily.    Historical Provider, MD  cycloSPORINE (SANDIMMUNE) 25 MG capsule Take 25 mg by mouth 2 (two) times daily.    Historical Provider, MD  diltiazem (DILACOR XR) 180 MG 24 hr capsule Take 180 mg by mouth daily.    Historical Provider, MD  methocarbamol (ROBAXIN) 500 MG tablet Take 2 tablets (1,000 mg total) by mouth every 8 (eight) hours as needed for muscle spasms. 10/16/13   Sherren Mocha, MD  OVER THE COUNTER MEDICATION OTC Calcium-vitamin D 600 mg-intl units oral capsule taking twice daily    Historical Provider, MD  OVER THE COUNTER MEDICATION Folic acid 1 mg oral tablet taking 1 tablet daily    Historical Provider, MD  OVER THE COUNTER MEDICATION Magnesium oxide 400 mg oral tablet 2 tablets daily    Historical Provider, MD  PRESCRIPTION MEDICATION Mycophenolate mofetil 500 mg oral tablets every 12 hours  Historical Provider, MD  PRESCRIPTION MEDICATION Pantoprazole 40 mg oral delayed release tablet take one by mouth daily    Historical Provider, MD    Family History Family History  Problem Relation Age of Onset  . Heart disease Mother     Social History Social History  Substance Use Topics  . Smoking status: Unknown If Ever Smoked  . Smokeless tobacco: Not on file  . Alcohol use No     Allergies   Review of patient's allergies indicates no known allergies.   Review of Systems Review of Systems  All other  systems reviewed and are negative.    Physical Exam Updated Vital Signs BP (!) 150/102 (BP Location: Right Arm)   Pulse 98   Temp 98.5 F (36.9 C) (Oral)   Resp 16   SpO2 100%   Physical Exam  Constitutional: He is oriented to person, place, and time. He appears well-developed and well-nourished. No distress.  HENT:  Head: Normocephalic and atraumatic.  Mouth/Throat: No oropharyngeal exudate.  Eyes: Conjunctivae and EOM are normal. Pupils are equal, round, and reactive to light. Right eye exhibits no discharge. Left eye exhibits no discharge. No scleral icterus.  Cardiovascular: Normal rate, regular rhythm, normal heart sounds and intact distal pulses.  Exam reveals no gallop and no friction rub.   No murmur heard. Pulmonary/Chest: Effort normal and breath sounds normal. No respiratory distress. He has no wheezes. He has no rales. He exhibits no tenderness.  Abdominal: Soft. He exhibits no distension. There is no tenderness. There is no guarding.  Musculoskeletal: Normal range of motion. He exhibits no edema.  Neurological: He is alert and oriented to person, place, and time.  Strength 5/5 throughout. No sensory deficits. No gait abnormality. No dysmetria.   Skin: Skin is warm and dry. No rash noted. He is not diaphoretic. No erythema. No pallor.  Psychiatric: He has a normal mood and affect. His behavior is normal.  Nursing note and vitals reviewed.    ED Treatments / Results  Labs (all labs ordered are listed, but only abnormal results are displayed) Labs Reviewed  CBC  COMPREHENSIVE METABOLIC PANEL  D-DIMER, QUANTITATIVE (NOT AT [facility])  Rosezena SensorI-STAT TROPOININ, ED    EKG   Radiology No results found.  Procedures Procedures (including critical care time)  Medications Ordered in ED Medications - No data to display   Initial Impression / Assessment and Plan / ED Course  I have reviewed the triage vital signs and the nursing notes.  Pertinent labs & imaging results  that were available during my care of the patient were reviewed by me and considered in my medical decision making (see chart for details).  50 year old male with history of amyloidosis, status post cardiac and liver [place] presents to the ED today complaining of chest pain/tightness. Presentation to ED patient appears well and is in no apparent distress. Patient is currently asymptomatic. No sign of rejection, no significant DOE or edema. Normal heart and lung exam. No neurological deficits. Labwork unremarkable. EKG shows no signs of ischemia. Troponin within normal limits. Negative d-dimer. Will attempt to obtain records from [facility].   Received call from radiology about CXR. There is no lead visualized from defibrillator going to the mediastinum.  Per pt, defibrillator was disconnected after surgery so likely wire was removed.   Clinical Course  Comment By Time  Unfortunately, [facility] medical records is closed today. Will attempt to contact pts nursing coordinator for records. Dub MikesSamantha Tripp Dowless, PA-C  10/01 1610  Spoke with Dr. Lonell Face who is apart of pts transplant team at [facility]. She will fax copy of most recent progress note, cardiac cath report, and Echo result.   Records faxed and reviewed, patient was doing well at last follow-up visit was normal lab values. Last cardiac cath was in February 2017 which showed no critical coronary artery disease, normal left and right sided intracardiac filling pressures and normal cardiac index. On echo patient had ejection fraction of 55-60%.  Patient has remained asymptomatic while in the ED. Delta troponin unchanged from previous. Low suspicion ACS. Patient has had similar ongoing symptoms for the last 2 months and has been seen by history asthma team in the meantime who were not concerned about his symptoms. She'll the patient is safe for discharge with cardiology follow-up. Return precautions outlined in patient  discharge instructions.  Patient was discussed with and seen by Dr. Particia Nearing who agrees with the treatment plan.     Final Clinical Impressions(s) / ED Diagnoses   Final diagnoses:  Atypical chest pain    New Prescriptions New Prescriptions   No medications on file     Dub Mikes, PA-C 08/12/16 1536    Jacalyn Lefevre, MD 08/14/16 [phone]

## 2016-08-12 NOTE — ED Triage Notes (Signed)
Pt c/o intermittent chest pressure -- radiates to left arm and jaw.  HX OF HEART/[place]IVER TRANSPL[place].

## 2016-08-12 NOTE — Discharge Instructions (Signed)
Follow up with PCP as soon as possible for re-evaluation. Continue home medications as prescribed. Return to the ED if you experience severe worsening of your symptoms, difficulty breathing, increased pain, loss of consciousness, swelling.

## 2016-08-12 NOTE — ED Notes (Signed)
Re-paged Liver Coordinator to [phone]

## 2016-08-12 NOTE — ED Notes (Signed)
Paged Liver Coordinator to [phone]

## 2016-10-30 DIAGNOSIS — Z4823 Encounter for aftercare following liver transplant: Secondary | ICD-10-CM | POA: Diagnosis not present

## 2016-10-30 DIAGNOSIS — Z944 Liver transplant status: Secondary | ICD-10-CM | POA: Diagnosis not present

## 2016-10-30 DIAGNOSIS — F418 Other specified anxiety disorders: Secondary | ICD-10-CM | POA: Diagnosis not present

## 2016-10-30 DIAGNOSIS — T8649 Other complications of liver transplant: Secondary | ICD-10-CM | POA: Diagnosis not present

## 2016-10-30 DIAGNOSIS — R002 Palpitations: Secondary | ICD-10-CM | POA: Diagnosis not present

## 2016-10-30 DIAGNOSIS — I429 Cardiomyopathy, unspecified: Secondary | ICD-10-CM | POA: Diagnosis not present

## 2016-10-30 DIAGNOSIS — N183 Chronic kidney disease, stage 3 (moderate): Secondary | ICD-10-CM | POA: Diagnosis not present

## 2016-10-30 DIAGNOSIS — K432 Incisional hernia without obstruction or gangrene: Secondary | ICD-10-CM | POA: Diagnosis not present

## 2016-10-30 DIAGNOSIS — E785 Hyperlipidemia, unspecified: Secondary | ICD-10-CM | POA: Diagnosis not present

## 2016-10-30 DIAGNOSIS — E859 Amyloidosis, unspecified: Secondary | ICD-10-CM | POA: Diagnosis not present

## 2016-10-30 DIAGNOSIS — Z941 Heart transplant status: Secondary | ICD-10-CM | POA: Diagnosis not present

## 2016-10-30 DIAGNOSIS — I129 Hypertensive chronic kidney disease with stage 1 through stage 4 chronic kidney disease, or unspecified chronic kidney disease: Secondary | ICD-10-CM | POA: Diagnosis not present

## 2016-10-30 DIAGNOSIS — R251 Tremor, unspecified: Secondary | ICD-10-CM | POA: Diagnosis not present

## 2017-02-23 IMAGING — CR DG CHEST 2V
2 series · 2 of 2 positions shown · non-contrast
Comparison: None.

CLINICAL DATA: Pt c/o of left sided chest pains, he said they have
subsided now and is just pressure, but woke up this morning at 4 am
with sharp pains. Hx of CHF, and Heart transplant x 4 years ago, has
pacemaker. Non-smoker

EXAM:
CHEST  2 VIEW

[chest pa]
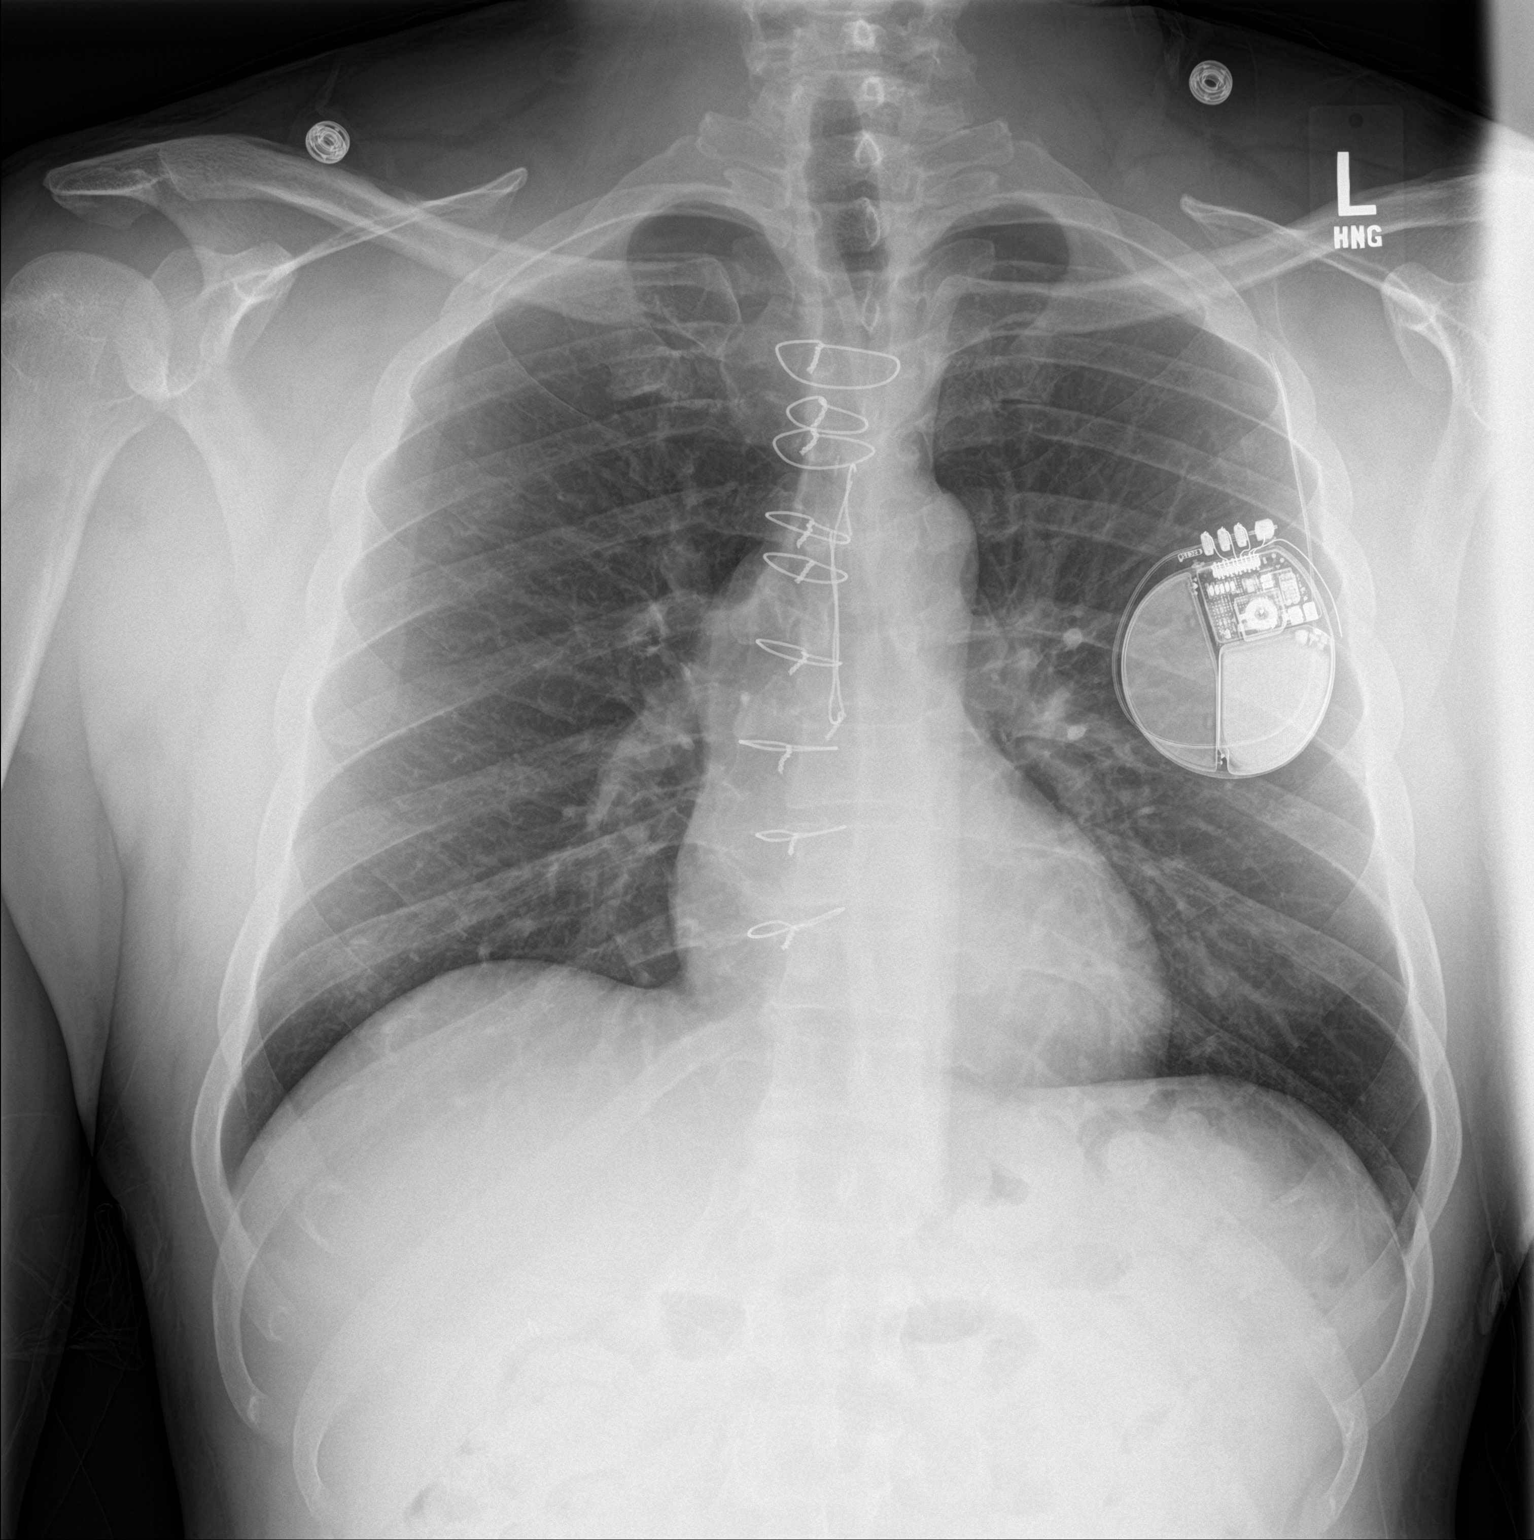

[chest lat]
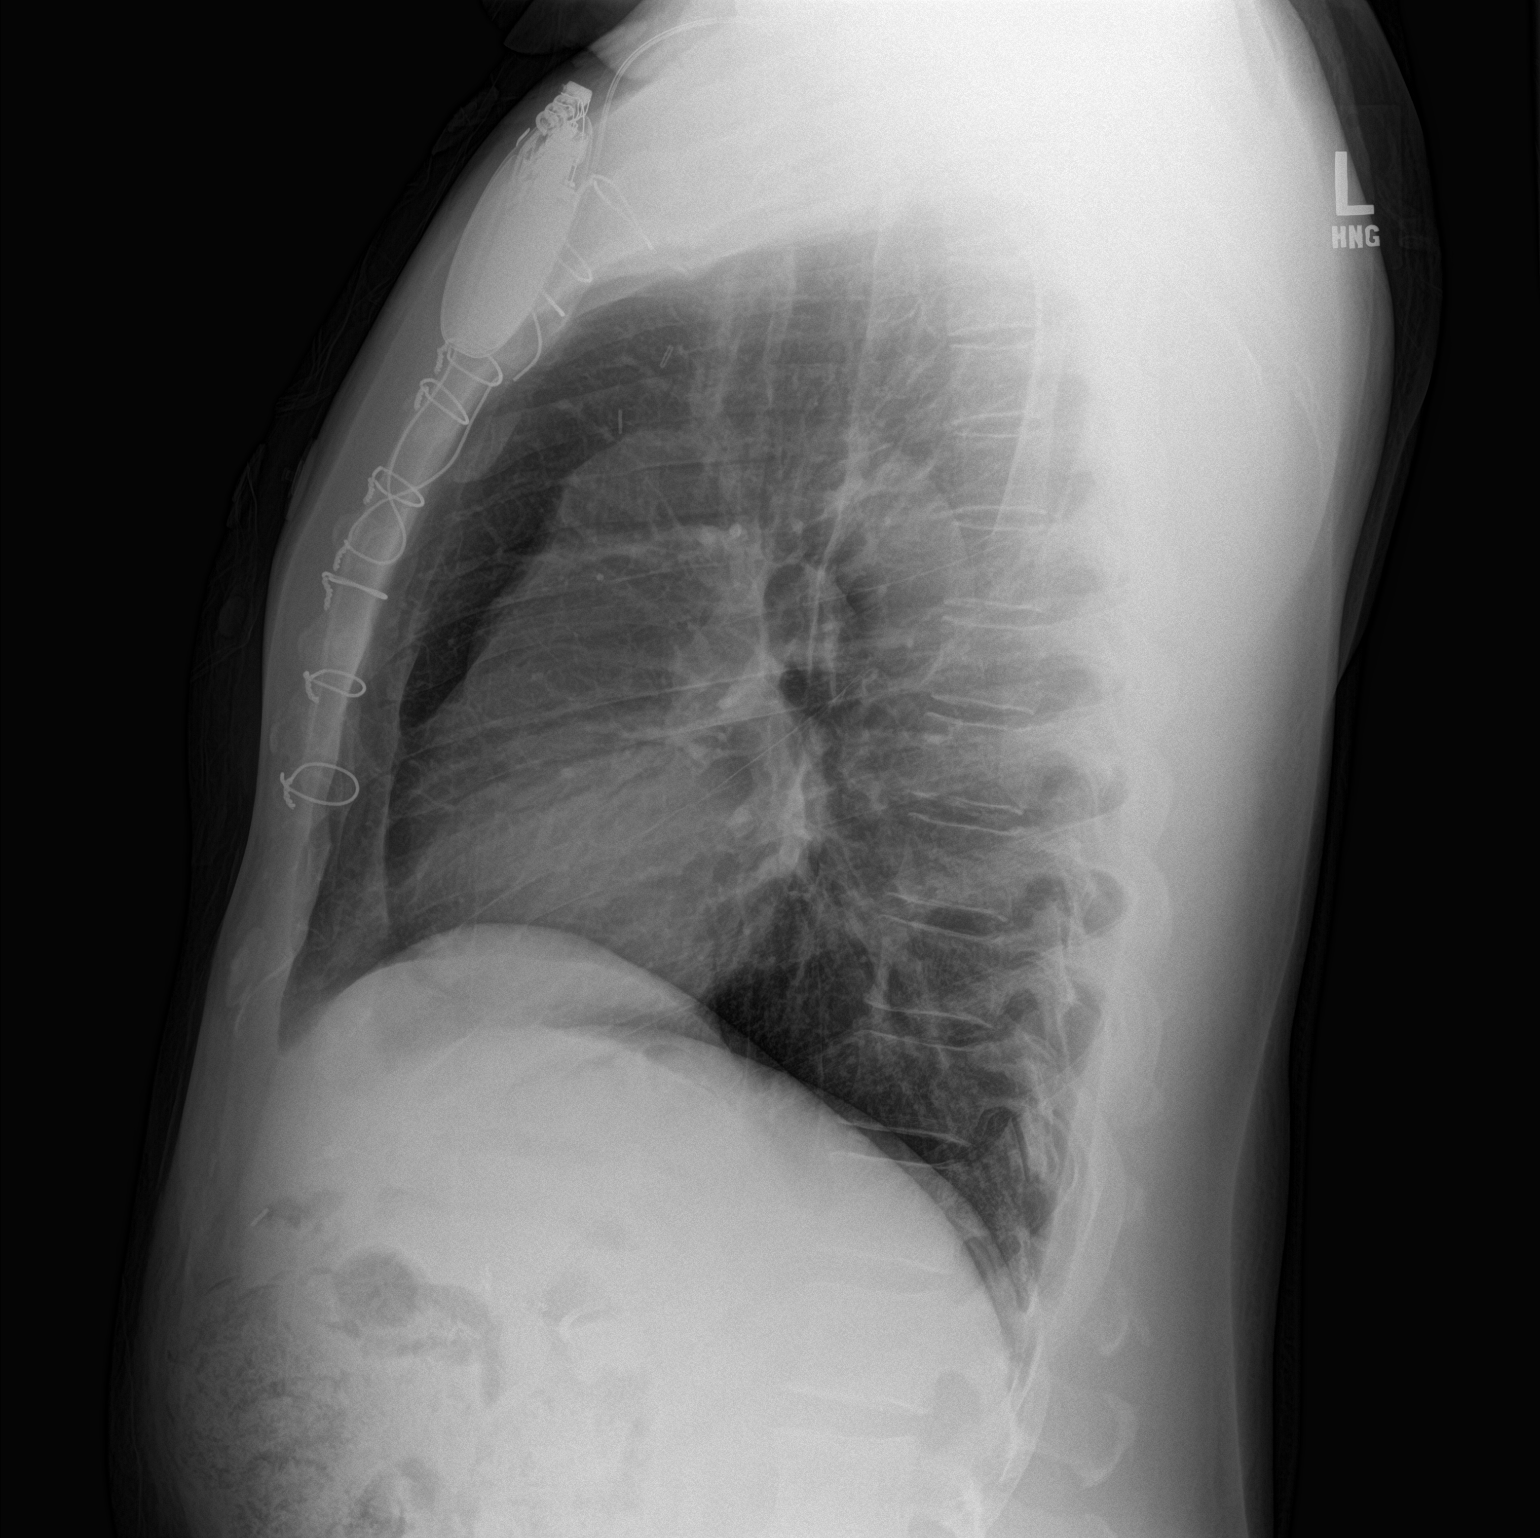

[2 of 2 positions shown; findings below may reference images not displayed]

FINDINGS: Heart size is normal. Median sternotomy wires appear intact and
appropriately aligned. Lungs are clear. No pleural effusion or
pneumothorax seen. No acute or suspicious osseous finding.

Left chest wall ICD in place. Lead/wire projects upwards to the
level of the left third rib. No leads seen from the device to the
mediastinum.
IMPRESSION: 1. No acute findings.  Lungs are clear.
2. Left chest wall ICD in place. Lead projects upwards from the
device to the level of the left third rib. No lead seen from the
device to the mediastinum. Lead fracture and/or displacement? Please
confirm with procedural history.
These results were called by telephone at the time of interpretation
on 08/12/2016 at [DATE] to PA Dockery, who verbally acknowledged
these results.

## 2017-04-30 DIAGNOSIS — R1084 Generalized abdominal pain: Secondary | ICD-10-CM | POA: Diagnosis not present

## 2017-04-30 DIAGNOSIS — E852 Heredofamilial amyloidosis, unspecified: Secondary | ICD-10-CM | POA: Diagnosis not present

## 2017-04-30 DIAGNOSIS — K5900 Constipation, unspecified: Secondary | ICD-10-CM | POA: Diagnosis not present

## 2017-04-30 DIAGNOSIS — R634 Abnormal weight loss: Secondary | ICD-10-CM | POA: Diagnosis not present

## 2017-04-30 DIAGNOSIS — E785 Hyperlipidemia, unspecified: Secondary | ICD-10-CM | POA: Diagnosis not present

## 2017-04-30 DIAGNOSIS — Z941 Heart transplant status: Secondary | ICD-10-CM | POA: Diagnosis not present

## 2017-04-30 DIAGNOSIS — I10 Essential (primary) hypertension: Secondary | ICD-10-CM | POA: Diagnosis not present

## 2017-04-30 DIAGNOSIS — R109 Unspecified abdominal pain: Secondary | ICD-10-CM | POA: Diagnosis not present

## 2017-04-30 DIAGNOSIS — E8582 Wild-type transthyretin-related (ATTR) amyloidosis: Secondary | ICD-10-CM | POA: Diagnosis not present

## 2017-04-30 DIAGNOSIS — T8640 Unspecified complication of liver transplant: Secondary | ICD-10-CM | POA: Diagnosis not present

## 2017-04-30 DIAGNOSIS — Z944 Liver transplant status: Secondary | ICD-10-CM | POA: Diagnosis not present

## 2017-06-07 DIAGNOSIS — T8640 Unspecified complication of liver transplant: Secondary | ICD-10-CM | POA: Diagnosis not present

## 2017-06-07 DIAGNOSIS — N281 Cyst of kidney, acquired: Secondary | ICD-10-CM | POA: Diagnosis not present

## 2017-06-07 DIAGNOSIS — Z941 Heart transplant status: Secondary | ICD-10-CM | POA: Diagnosis not present

## 2017-07-20 ENCOUNTER — Other Ambulatory Visit: Payer: Self-pay | Admitting: Family Medicine

## 2017-07-20 ENCOUNTER — Ambulatory Visit (INDEPENDENT_AMBULATORY_CARE_PROVIDER_SITE_OTHER): Payer: Medicare Other | Admitting: Family Medicine

## 2017-07-20 ENCOUNTER — Encounter: Payer: Self-pay | Admitting: Family Medicine

## 2017-07-20 VITALS — BP 124/92 | HR 76 | Temp 98.4°F | Resp 16 | Ht 68.75 in | Wt 171.6 lb

## 2017-07-20 DIAGNOSIS — Z9489 Other transplanted organ and tissue status: Secondary | ICD-10-CM | POA: Diagnosis not present

## 2017-07-20 DIAGNOSIS — E850 Non-neuropathic heredofamilial amyloidosis: Secondary | ICD-10-CM | POA: Diagnosis not present

## 2017-07-20 DIAGNOSIS — R634 Abnormal weight loss: Secondary | ICD-10-CM

## 2017-07-20 DIAGNOSIS — D899 Disorder involving the immune mechanism, unspecified: Secondary | ICD-10-CM

## 2017-07-20 DIAGNOSIS — D849 Immunodeficiency, unspecified: Secondary | ICD-10-CM

## 2017-07-20 DIAGNOSIS — R590 Localized enlarged lymph nodes: Secondary | ICD-10-CM

## 2017-07-20 LAB — POCT RAPID STREP A (OFFICE): RAPID STREP A SCREEN: NEGATIVE

## 2017-07-20 LAB — POCT SKIN KOH: Skin KOH, POC: NEGATIVE

## 2017-07-20 MED ORDER — NYSTATIN 100000 UNIT/ML MT SUSP: 5.0000 mL | Freq: Four times a day (QID) | OROMUCOSAL | 0 refills | Status: DC

## 2017-07-20 NOTE — Patient Instructions (Addendum)
   IF you received an x-ray today, you will receive an invoice from [facility]. Please contact [facility] at [phone] with questions or concerns regarding your invoice.   IF you received labwork today, you will receive an invoice from [facility]. Please contact [facility] at [phone] with questions or concerns regarding your invoice.   Our billing staff will not be able to assist you with questions regarding bills from these companies.  You will be contacted with the lab results as soon as they are available. The fastest way to get your results is to activate your My Chart account. Instructions are located on the last page of this paperwork. If you have not heard from us regarding the results in 2 weeks, please contact this office.      Oral Thrush, Adult Oral thrush, also called oral candidiasis, is a fungal infection that develops in the mouth and throat and on the tongue. It causes white patches to form on the mouth and tongue. Thrush is most common in older adults, but it can occur at any age. Many cases of thrush are mild, but this infection can also be serious. Thrush can be a repeated (recurrent) problem for certain people who have a weak body defense system (immune system). The weakness can be caused by chronic illnesses, or by taking medicines that limit the body's ability to fight infection. If a person has difficulty fighting infection, the fungus that causes thrush can spread through the body. This can cause life-threatening blood or organ infections. What are the causes? This condition is caused by a fungus (yeast) called Candida albicans.  This fungus is normally present in small amounts in the mouth and on other mucous membranes. It usually causes no harm.  If conditions are present that allow the fungus to grow without control, it invades surrounding tissues and becomes an infection.  Other Candida species can also lead to thrush (rare). What  increases the risk? This condition is more likely to develop in:  People with a weakened immune system.  Older adults.  People with HIV (human immunodeficiency virus).  People with diabetes.  People with dry mouth (xerostomia).  Pregnant women.  People with poor dental care, especially people who have false teeth.  People who use antibiotic medicines. What are the signs or symptoms? Symptoms of this condition can vary from mild and moderate to severe and persistent. Symptoms may include:  A burning feeling in the mouth and throat. This can occur at the start of a thrush infection.  White patches that stick to the mouth and tongue. The tissue around the patches may be red, raw, and painful. If rubbed (during tooth brushing, for example), the patches and the tissue of the mouth may bleed easily.  A bad taste in the mouth or difficulty tasting foods.  A cottony feeling in the mouth.  Pain during eating and swallowing.  Poor appetite.  Cracking at the corners of the mouth. How is this diagnosed? This condition is diagnosed based on:  Physical exam. Your health care provider will look in your mouth.  Health history. Your health care provider will ask you questions about your health. How is this treated? This condition is treated with medicines called antifungals, which prevent the growth of fungi. These medicines are either applied directly to the affected area (topical) or swallowed (oral). The treatment will depend on the severity of the condition. Mild thrush  Mild cases of thrush may clear up with the use of an   antifungal mouth rinse or lozenges. Treatment usually lasts about 14 days. Moderate to severe thrush  More severe thrush infections that have spread to the esophagus are treated with an oral antifungal medicine. A topical antifungal medicine may also be used.  For some severe infections, treatment may need to continue for more than 14 days.  Oral antifungal  medicines are rarely used during pregnancy because they may be harmful to the unborn child. If you are pregnant, talk with your health care provider about options for treatment. Persistent or recurrent thrush  For cases of thrush that do not go away or keep coming back:  Treatment may be needed twice as long as the symptoms last.  Treatment will include both oral and topical antifungal medicines.  People with a weakened immune system can take an antifungal medicine on a continuous basis to prevent thrush infections. It is important to treat conditions that make a person more likely to get thrush, such as diabetes or HIV. Follow these instructions at home: Medicines  Take over-the-counter and prescription medicines only as told by your health care provider.  Talk with your health care provider about an over-the-counter medicine called gentian violet, which kills bacteria and fungi. Relieving soreness and discomfort  To help reduce the discomfort of thrush:  Drink cold liquids such as water or iced tea.  Try flavored ice treats or frozen juices.  Eat foods that are easy to swallow, such as gelatin, ice cream, or custard.  Try drinking from a straw if the patches in your mouth are painful. General instructions  Eat plain, unflavored yogurt as directed by your health care provider. Check the label to make sure the yogurt contains live cultures. This yogurt can help healthy bacteria to grow in the mouth and can stop the growth of the fungus that causes thrush.  If you wear dentures, remove the dentures before going to bed, brush them vigorously, and soak them in a cleaning solution as directed by your health care provider.  Rinse your mouth with a warm salt-water mixture several times a day. To make a salt-water mixture, completely dissolve 1/2-1 tsp of salt in 1 cup of warm water. Contact a health care provider if:  Your symptoms are getting worse or are not improving within 7 days of  starting treatment.  You have symptoms of a spreading infection, such as white patches on the skin outside of the mouth. This information is not intended to replace advice given to you by your health care provider. Make sure you discuss any questions you have with your health care provider. Document Released: 07/24/2004 Document Revised: 07/23/2016 Document Reviewed: 07/23/2016 Elsevier Interactive Patient Education  2017 Elsevier Inc.  

## 2017-07-20 NOTE — Progress Notes (Addendum)
Subjective:  By signing my name below, I, Jason Ware, attest that this documentation has been prepared under the direction and in the presence of Jason SorensonEva Catrena Vari, MD Electronically Signed: Charline BillsEssence Ware, ED Scribe 07/20/2017 at 1:52 PM.   Patient ID: Jason KellerLarry Ware, male    DOB: 05/01/1966, 51 y.o.   MRN: [id number]  Chief Complaint  Patient presents with  . Edema    neck, 2 months    HPI Jason Ware is a 51 y.o. male who presents to [facility] at [facility] complaining of intermittent cervical adenopathy first noticed 2-3 months ago. Pt reports moderate energy level, ~20 lbs weight loss without a conscious effort, night sweats 2-3 nights/week. Has noticed chronic heart burn and occasional inflamed gums. Denies sore throat, allergies, congestion, postnasal drip, changes in hair or skin. Pt is followed [facility] in [place], [place] where he last had blood work done in July and last saw his dentist last year. On 7/27, creatinine was 1.76-1.9. Normal CMP, iron, magnesium, ferritin. Hgb 11.9 with hematocrit of 36.5. MCV 84.9. Normal WBC, normal diff.   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   Wt Readings from Last 3 Encounters:  07/20/17 171 lb 9.6 oz (77.8 kg)  10/16/13 210 lb 9.6 oz (95.5 kg)  09/04/13 203 lb 6.4 oz (92.3 kg)   Past Medical History:  Diagnosis Date  . Amyloidosis (HCC)   . Anxiety   . CHF (congestive heart failure) (HCC)   . Depression   . ICD (implantable cardioverter-defibrillator) in place    Current Outpatient Prescriptions on File Prior to Visit  Medication Sig Dispense Refill  . aspirin EC 81 MG tablet Take 81 mg by mouth every morning.    . citalopram (CELEXA) 20 MG tablet Take 30 mg by mouth daily.     . cycloSPORINE (SANDIMMUNE) 100 MG capsule Take 125-150 mg by mouth See admin instructions. 150 mg every morning and 125 mg every evening    . cycloSPORINE (SANDIMMUNE) 25 MG capsule Take 125-250 mg by mouth See admin instructions. Pt takes 25  mg tablet with the 100 mg tablet in the evening [150 mg (1.5 tablets of the 100 mg) is in the morning]    . magnesium oxide (MAG-OX) 400 MG tablet Take 800 mg by mouth daily.    . mycophenolate (CELLCEPT) 500 MG tablet Take 1,000 mg by mouth 2 (two) times daily.    . pantoprazole (PROTONIX) 40 MG tablet Take 40 mg by mouth daily.     No current facility-administered medications on file prior to visit.    No Known Allergies   Review of Systems  Constitutional: Positive for diaphoresis, fatigue (moderate) and unexpected weight change.  HENT: Negative for congestion, postnasal drip and sore throat.   Allergic/Immunologic: Negative for environmental allergies.  Hematological: Positive for adenopathy.      Objective:   Physical Exam  Constitutional: He is oriented to person, place, and time. He appears well-developed and well-nourished. No distress.  HENT:  Head: Normocephalic and atraumatic.  Nose: Nose normal.  Mouth/Throat: Oropharynx is clear and moist.  Tongue is pale  Eyes: Conjunctivae and EOM are normal.  Neck: Neck supple. No tracheal deviation present. No thyroid mass present.  Cardiovascular: Normal rate, regular rhythm, S1 normal, S2 normal and normal heart sounds.   Pulmonary/Chest: Effort normal. No respiratory distress.  Lungs clear. Good air movement.  Musculoskeletal: Normal range of motion.  Lymphadenopathy:       Head (right  side): Tonsillar adenopathy present.       Head (left side): Tonsillar adenopathy present.    He has cervical adenopathy (anterior, L greater than R).       Right cervical: Posterior cervical adenopathy present.       Left cervical: Posterior cervical adenopathy present.  Neurological: He is alert and oriented to person, place, and time.  Skin: Skin is warm and dry.  Psychiatric: He has a normal mood and affect. His behavior is normal.  Nursing note and vitals reviewed.  BP (!) 124/92   Pulse 76   Temp 98.4 F (36.9 C) (Oral)   Resp 16    Ht 5' 8.75" (1.746 m)   Wt 171 lb 9.6 oz (77.8 kg)   SpO2 99%   BMI 25.53 kg/m     Results for orders placed or performed in visit on 07/20/17  POCT Skin KOH  Result Value Ref Range   Skin KOH, POC Negative Negative  POCT rapid strep A  Result Value Ref Range   Rapid Strep A Screen Negative Negative   Assessment & Plan:   1. Anterior cervical adenopathy - poss due to thrush from long-term immunosuppression  2. Transplant recipient   3. Non-neuropathic heredofamilial amyloidosis (HCC)   4. Immunosuppressed status (HCC)   5. Weight loss, unintentional     Orders Placed This Encounter  Procedures  . Culture, Group A Strep    Order Specific Question:   Source    Answer:   oropharynx  . CBC with Differential/Platelet  . Sedimentation Rate  . C-reactive protein  . POCT Skin KOH  . POCT rapid strep A    Meds ordered this encounter  Medications  . nystatin (MYCOSTATIN) 100000 UNIT/ML suspension    Sig: Take 5 mLs (500,000 Units total) by mouth 4 (four) times daily. Switch around mouth and swallow until gone    Dispense:  240 mL    Refill:  0    I personally performed the services described in this documentation, which was scribed in my presence. The recorded information has been reviewed and considered, and addended by me as needed.   Jason Ware, M.D.  Primary Care at [facility] [address] [place], [place] [address] [phone] phone [phone] fax  07/23/17 12:07 AM  Results for orders placed or performed in visit on 07/20/17  Culture, Group A Strep  Result Value Ref Range   Strep A Culture Comment   CBC with Differential/Platelet  Result Value Ref Range   WBC 2.9 (L) 3.4 - 10.8 x10E3/uL   RBC 4.41 4.14 - 5.80 x10E6/uL   Hemoglobin 12.5 (L) 13.0 - 17.7 g/dL   Hematocrit 13.0 (L) 86.5 - 51.0 %   MCV 85 79 - 97 fL   MCH 28.3 26.6 - 33.0 pg   MCHC 33.4 31.5 - 35.7 g/dL   RDW 78.4 69.6 - 29.5 %   Platelets 228 150 - 379 x10E3/uL    Neutrophils 52 Not Estab. %   Lymphs 38 Not Estab. %   Monocytes 9 Not Estab. %   Eos 1 Not Estab. %   Basos 0 Not Estab. %   Neutrophils Absolute 1.5 1.4 - 7.0 x10E3/uL   Lymphocytes Absolute 1.1 0.7 - 3.1 x10E3/uL   Monocytes Absolute 0.3 0.1 - 0.9 x10E3/uL   EOS (ABSOLUTE) 0.0 0.0 - 0.4 x10E3/uL   Basophils Absolute 0.0 0.0 - 0.2 x10E3/uL   Immature Granulocytes 0 Not Estab. %   Immature Grans (  Abs) 0.0 0.0 - 0.1 x10E3/uL  Sedimentation Rate  Result Value Ref Range   Sed Rate 3 0 - 30 mm/hr  C-reactive protein  Result Value Ref Range   CRP <0.3 0.0 - 4.9 mg/L  POCT Skin KOH  Result Value Ref Range   Skin KOH, POC Negative Negative  POCT rapid strep A  Result Value Ref Range   Rapid Strep A Screen Negative Negative

## 2017-07-21 LAB — CBC WITH DIFFERENTIAL/PLATELET
Basophils Absolute: 0 10*3/uL (ref 0.0–0.2)
Basos: 0 %
EOS (ABSOLUTE): 0 10*3/uL (ref 0.0–0.4)
EOS: 1 %
HEMATOCRIT: 37.4 % — AB (ref 37.5–51.0)
HEMOGLOBIN: 12.5 g/dL — AB (ref 13.0–17.7)
Immature Grans (Abs): 0 10*3/uL (ref 0.0–0.1)
Immature Granulocytes: 0 %
LYMPHS ABS: 1.1 10*3/uL (ref 0.7–3.1)
Lymphs: 38 %
MCH: 28.3 pg (ref 26.6–33.0)
MCHC: 33.4 g/dL (ref 31.5–35.7)
MCV: 85 fL (ref 79–97)
MONOCYTES: 9 %
Monocytes Absolute: 0.3 10*3/uL (ref 0.1–0.9)
NEUTROS ABS: 1.5 10*3/uL (ref 1.4–7.0)
Neutrophils: 52 %
Platelets: 228 10*3/uL (ref 150–379)
RBC: 4.41 x10E6/uL (ref 4.14–5.80)
RDW: 14.3 % (ref 12.3–15.4)
WBC: 2.9 10*3/uL — ABNORMAL LOW (ref 3.4–10.8)

## 2017-07-21 LAB — SEDIMENTATION RATE: Sed Rate: 3 mm/hr (ref 0–30)

## 2017-07-21 LAB — C-REACTIVE PROTEIN

## 2017-07-23 LAB — CULTURE, GROUP A STREP: STREP A CULTURE: NEGATIVE

## 2017-08-05 ENCOUNTER — Encounter: Payer: Self-pay | Admitting: Family Medicine

## 2017-08-05 ENCOUNTER — Ambulatory Visit (INDEPENDENT_AMBULATORY_CARE_PROVIDER_SITE_OTHER): Payer: Medicare Other | Admitting: Family Medicine

## 2017-08-05 DIAGNOSIS — Z23 Encounter for immunization: Secondary | ICD-10-CM | POA: Diagnosis not present

## 2017-08-05 NOTE — Patient Instructions (Signed)
     IF you received an x-ray today, you will receive an invoice from [facility]. Please contact [facility] at [phone] with questions or concerns regarding your invoice.   IF you received labwork today, you will receive an invoice from [facility]. Please contact [facility] at [phone] with questions or concerns regarding your invoice.   Our billing staff will not be able to assist you with questions regarding bills from these companies.  You will be contacted with the lab results as soon as they are available. The fastest way to get your results is to activate your My Chart account. Instructions are located on the last page of this paperwork. If you have not heard from us regarding the results in 2 weeks, please contact this office.     

## 2017-08-05 NOTE — Progress Notes (Signed)
Patient was here for flu shot  

## 2017-08-08 ENCOUNTER — Ambulatory Visit: Payer: Medicare Other | Admitting: Family Medicine

## 2017-08-27 DIAGNOSIS — I499 Cardiac arrhythmia, unspecified: Secondary | ICD-10-CM | POA: Diagnosis not present

## 2017-08-27 DIAGNOSIS — Z944 Liver transplant status: Secondary | ICD-10-CM | POA: Diagnosis not present

## 2017-08-27 DIAGNOSIS — Z95810 Presence of automatic (implantable) cardiac defibrillator: Secondary | ICD-10-CM | POA: Diagnosis not present

## 2017-08-27 DIAGNOSIS — N183 Chronic kidney disease, stage 3 (moderate): Secondary | ICD-10-CM | POA: Diagnosis not present

## 2017-08-27 DIAGNOSIS — E785 Hyperlipidemia, unspecified: Secondary | ICD-10-CM | POA: Diagnosis not present

## 2017-08-27 DIAGNOSIS — E859 Amyloidosis, unspecified: Secondary | ICD-10-CM | POA: Diagnosis not present

## 2017-08-27 DIAGNOSIS — I129 Hypertensive chronic kidney disease with stage 1 through stage 4 chronic kidney disease, or unspecified chronic kidney disease: Secondary | ICD-10-CM | POA: Diagnosis not present

## 2017-08-27 DIAGNOSIS — F419 Anxiety disorder, unspecified: Secondary | ICD-10-CM | POA: Diagnosis not present

## 2017-08-27 DIAGNOSIS — R0602 Shortness of breath: Secondary | ICD-10-CM | POA: Diagnosis not present

## 2017-08-27 DIAGNOSIS — F329 Major depressive disorder, single episode, unspecified: Secondary | ICD-10-CM | POA: Diagnosis not present

## 2017-08-27 DIAGNOSIS — Z941 Heart transplant status: Secondary | ICD-10-CM | POA: Diagnosis not present

## 2017-08-27 DIAGNOSIS — Z95812 Presence of fully implantable artificial heart: Secondary | ICD-10-CM | POA: Diagnosis not present

## 2017-11-25 ENCOUNTER — Ambulatory Visit (INDEPENDENT_AMBULATORY_CARE_PROVIDER_SITE_OTHER): Payer: Medicare Other | Admitting: Family Medicine

## 2017-11-25 ENCOUNTER — Other Ambulatory Visit: Payer: Self-pay

## 2017-11-25 ENCOUNTER — Encounter: Payer: Self-pay | Admitting: Family Medicine

## 2017-11-25 VITALS — BP 126/84 | HR 78 | Temp 98.3°F | Resp 16 | Ht 68.75 in | Wt 174.0 lb

## 2017-11-25 DIAGNOSIS — G43109 Migraine with aura, not intractable, without status migrainosus: Secondary | ICD-10-CM

## 2017-11-25 MED ORDER — AMITRIPTYLINE HCL 25 MG PO TABS: 25.0000 mg | ORAL_TABLET | Freq: Every day | ORAL | 3 refills | Status: DC

## 2017-11-25 MED ORDER — PROMETHAZINE HCL 25 MG PO TABS: 25.0000 mg | ORAL_TABLET | Freq: Three times a day (TID) | ORAL | 2 refills | Status: DC | PRN

## 2017-11-25 MED ORDER — RIZATRIPTAN BENZOATE 10 MG PO TABS: 10.0000 mg | ORAL_TABLET | ORAL | 11 refills | Status: DC | PRN

## 2017-11-25 NOTE — Progress Notes (Signed)
Subjective:  By signing my name below, I, Jason Ware, attest that this documentation has been prepared under the direction and in the presence of Jason Sorenson, MD. Electronically Signed: Stann Ware, Scribe. 11/25/2017 , 10:13 AM .  Patient was seen in Room 1 .   Patient ID: Jason Ware, male    DOB: 12-03-1965, 52 y.o.   MRN: [id number] Chief Complaint  Patient presents with  . Migraine    intermittenly for a few months and more consitant for past 2 weeks and vision gets blurry sometimes   HPI Jason Ware is a 52 y.o. male who presents to Primary Care at [facility] complaining of intermittent migraines that lasts for about 12-14 hours, frequency worsening in the past few months. He has a past medical history of amyloidosis, underwent heart and liver transplant in Sept 2013. He still returns to his transplant and specialist team in [place], [place] for care and follow up.   Patient states he's had migraines most of his life. In his teenage years, migraines occurred moderately, and then dropped off when he was sick with amyloidosis. After about 2 years, his migraines had returned with increasing frequency recently. During migraines, he would have flashing lights in both of his eyes; when driving, he would pull over. He reports eyes feeling sore with movement. He has some associated nausea, phonophobia, and photophobia, but denies vomiting. He's taken imitrex in the past without much relief. He denies any medication changes recently. He's still taking magnesium 400mg , diltiazem 180mg , and citalopram 20mg . He's tried taking tylenol without much relief for his migraines. He mentions sleep disturbance, and being up at night.   His last lab work was done 3 months ago. He has a follow up in [place], [place] on Wednesday (Jan 16th).   Past Medical History:  Diagnosis Date  . Amyloidosis (HCC)   . Anxiety   . CHF (congestive heart failure) (HCC)   . Depression   . ICD (implantable cardioverter-defibrillator)  in place    Past Surgical History:  Procedure Laterality Date  . HEART TRANSPLANT    . HERNIA REPAIR    . LIVER TRANSPLANT     Prior to Admission medications   Medication Sig Start Date End Date Taking? Authorizing Provider  aspirin EC 81 MG tablet Take 81 mg by mouth every morning.    [provider]  citalopram (CELEXA) 20 MG tablet Take 30 mg by mouth daily.     [provider]  cycloSPORINE (SANDIMMUNE) 100 MG capsule Take 125-150 mg by mouth See admin instructions. 150 mg every morning and 125 mg every evening    [provider]  cycloSPORINE (SANDIMMUNE) 25 MG capsule Take 125-250 mg by mouth See admin instructions. Pt takes 25 mg tablet with the 100 mg tablet in the evening [150 mg (1.5 tablets of the 100 mg) is in the morning]    [provider]  magnesium oxide (MAG-OX) 400 MG tablet Take 800 mg by mouth daily.    [provider]  mycophenolate (CELLCEPT) 500 MG tablet Take 1,000 mg by mouth 2 (two) times daily.    [provider]  nystatin (MYCOSTATIN) 100000 UNIT/ML suspension Take 5 mLs (500,000 Units total) by mouth 4 (four) times daily. Switch around mouth and swallow until gone 07/20/17   Sherren Mocha, MD  pantoprazole (PROTONIX) 40 MG tablet Take 40 mg by mouth daily.    [provider]   No Known Allergies Family History  Problem Relation Age of  Onset  . Heart disease Mother    Social History   Socioeconomic History  . Marital status: Married    Spouse name: None  . Number of children: 1  . Years of education: college  . Highest education level: None  Social Needs  . Financial resource strain: None  . Food insecurity - worry: None  . Food insecurity - inability: None  . Transportation needs - medical: None  . Transportation needs - non-medical: None  Occupational History  . Occupation: disability  Tobacco Use  . Smoking status: Never Smoker  . Smokeless tobacco: Never Used  Substance and Sexual  Activity  . Alcohol use: No  . Drug use: No  . Sexual activity: None  Other Topics Concern  . None  Social History Narrative   Exercise: No.   Depression screen PHQ 2/9 07/20/2017  Decreased Interest 0  Down, Depressed, Hopeless 0  PHQ - 2 Score 0    Review of Systems  Constitutional: Negative for fatigue and unexpected weight change.  Eyes: Positive for photophobia. Negative for visual disturbance.  Respiratory: Negative for cough, chest tightness and shortness of breath.   Cardiovascular: Negative for chest pain, palpitations and leg swelling.  Gastrointestinal: Positive for nausea. Negative for abdominal pain, blood in stool and vomiting.  Neurological: Positive for headaches. Negative for dizziness and light-headedness.  Psychiatric/Behavioral: Positive for sleep disturbance.       Objective:   Physical Exam  Constitutional: He is oriented to person, place, and time. He appears well-developed and well-nourished. No distress.  HENT:  Head: Normocephalic and atraumatic.  Eyes: EOM are normal. Pupils are equal, round, and reactive to light.  Neck: Neck supple. No thyromegaly present.  Cardiovascular: Normal rate, regular rhythm and normal heart sounds.  No murmur heard. Pulmonary/Chest: Effort normal and breath sounds normal. No respiratory distress.  Musculoskeletal: Normal range of motion.  Neurological: He is alert and oriented to person, place, and time.  Skin: Skin is warm and dry.  Psychiatric: He has a normal mood and affect. His behavior is normal.  Nursing note and vitals reviewed.   BP 126/84   Pulse 78   Temp 98.3 F (36.8 C)   Resp 16   Ht 5' 8.75" (1.746 m)   Wt 174 lb (78.9 kg)   SpO2 100%   BMI 25.88 kg/m      Assessment & Plan:   1. Migraine with aura and without status migrainosus, not intractable   Sudden worsening of migraine in 52 yo male so may warrant imaging with repeat MRI - will have neurology referral to discuss. Start trial of elavil  for prevention w/ maxalt and promethazine to treat.  Orders Placed This Encounter  Procedures  . Ambulatory referral to Neurology    Referral Priority:   Routine    Referral Type:   Consultation    Referral Reason:   Specialty Services Required    Requested Specialty:   Neurology    Number of Visits Requested:   1    Meds ordered this encounter  Medications  . rizatriptan (MAXALT) 10 MG tablet    Sig: Take 1 tablet (10 mg total) by mouth as needed for migraine. May repeat in 2 hours if needed    Dispense:  9 tablet    Refill:  11  . promethazine (PHENERGAN) 25 MG tablet    Sig: Take 1 tablet (25 mg total) by mouth every 8 (eight) hours as needed for nausea (or migrain headache. will cause  sedation).    Dispense:  20 tablet    Refill:  2  . amitriptyline (ELAVIL) 25 MG tablet    Sig: Take 1 tablet (25 mg total) by mouth at bedtime.    Dispense:  30 tablet    Refill:  3    I personally performed the services described in this documentation, which was scribed in my presence. The recorded information has been reviewed and considered, and addended by me as needed.   Jason SorensonEva Hailei Besser, M.D.  Primary Care at [facility]  [place] [address] [place], [place] [address] [phone] phone [phone] fax  11/27/17 10:12 PM

## 2017-11-25 NOTE — Patient Instructions (Addendum)
If the amitryptiline is to sedating in the morning time, cut in half and take 1/2 tab at night for a month, then try to increase back to 1 whole tab a day.    IF you received an x-ray today, you will receive an invoice from [facility]. Please contact [facility] at [phone] with questions or concerns regarding your invoice.   IF you received labwork today, you will receive an invoice from [place]. Please contact [facility] at [phone] with questions or concerns regarding your invoice.   Our billing staff will not be able to assist you with questions regarding bills from these companies.  You will be contacted with the lab results as soon as they are available. The fastest way to get your results is to activate your My Chart account. Instructions are located on the last page of this paperwork. If you have not heard from [place] regarding the results in 2 weeks, please contact this office.      Recurrent Migraine Headache A migraine headache is very bad, throbbing pain that is usually on one side of your head. Recurrent migraines keep coming back (recurring). Talk with your doctor about what things may bring on (trigger) your migraine headaches. Follow these instructions at home: Medicines  Take over-the-counter and prescription medicines only as told by your doctor.  Do not drive or use heavy machinery while taking prescription pain medicine. Lifestyle  Do not use any products that contain nicotine or tobacco, such as cigarettes and e-cigarettes. If you need help quitting, ask your doctor.  Limit alcohol intake to no more than 1 drink a day for nonpregnant women and 2 drinks a day for men. One drink equals 12 oz of beer, 5 oz of wine, or 1 oz of hard liquor.  Get 7-9 hours of sleep each night.  Lessen any stress in your life. Ask your doctor about ways to lower your stress.  Stay at a healthy weight. Talk with your doctor if you need help losing weight.  Get  regular exercise. General instructions  Keep a journal to find out if certain things bring on migraine headaches. For example, write down: ? What you eat and drink. ? How much sleep you get. ? Any change to your diet or medicines.  Lie down in a dark, quiet room when you have a migraine.  Try placing a cool towel over your head when you have a migraine.  Keep lights dim if bright lights bother you or make your migraines worse.  Keep all follow-up visits as told by your doctor. This is important. Contact a doctor if:  Medicine does not help your migraines.  Your pain keeps coming back.  You have a fever.  You have weight loss without trying. Get help right away if:  Your migraine becomes really bad and medicine does not help.  You have a stiff neck.  You have trouble seeing.  Your muscles are weak or you lose control of your muscles.  You lose your balance or have trouble walking.  You feel like you will pass out (faint) or you pass out.  You have really bad symptoms that are different than your first symptoms.  You start having sudden, very bad headaches that last for one second or less, like a thunderclap. Summary  A migraine headache is very bad, throbbing pain that is usually on one side of your head.  Talk with your doctor about what things may bring on (trigger) your migraine headaches.  Take  over-the-counter and prescription medicines only as told by your doctor.  Lie down in a dark, quiet room when you have a migraine.  Keep a journal about what you eat and drink, how much sleep you get, and any changes to your medicines. This can help you find out if certain things make you have migraine headaches. This information is not intended to replace advice given to you by your health care provider. Make sure you discuss any questions you have with your health care provider. Document Released: 08/07/2008 Document Revised: 09/21/2016 Document Reviewed:  09/21/2016 Elsevier Interactive Patient Education  2017 [employer]

## 2017-11-27 DIAGNOSIS — I129 Hypertensive chronic kidney disease with stage 1 through stage 4 chronic kidney disease, or unspecified chronic kidney disease: Secondary | ICD-10-CM | POA: Diagnosis not present

## 2017-11-27 DIAGNOSIS — F419 Anxiety disorder, unspecified: Secondary | ICD-10-CM | POA: Diagnosis not present

## 2017-11-27 DIAGNOSIS — F339 Major depressive disorder, recurrent, unspecified: Secondary | ICD-10-CM | POA: Diagnosis not present

## 2017-11-27 DIAGNOSIS — E785 Hyperlipidemia, unspecified: Secondary | ICD-10-CM | POA: Diagnosis not present

## 2017-11-27 DIAGNOSIS — Z944 Liver transplant status: Secondary | ICD-10-CM | POA: Diagnosis not present

## 2017-11-27 DIAGNOSIS — D899 Disorder involving the immune mechanism, unspecified: Secondary | ICD-10-CM | POA: Diagnosis not present

## 2017-11-27 DIAGNOSIS — Z941 Heart transplant status: Secondary | ICD-10-CM | POA: Diagnosis not present

## 2017-11-27 DIAGNOSIS — N183 Chronic kidney disease, stage 3 (moderate): Secondary | ICD-10-CM | POA: Diagnosis not present

## 2018-01-02 ENCOUNTER — Other Ambulatory Visit: Payer: Self-pay | Admitting: Family Medicine

## 2018-01-02 NOTE — Telephone Encounter (Signed)
Refill for cyclosporine. Historical prescriber. LOV 11/25/17. Please advise.

## 2018-01-02 NOTE — Telephone Encounter (Signed)
Copied from CRM [id number]. Topic: Quick Communication - Rx Refill/Question >> Jan 02, 2018  8:50 AM [name], [name] wrote: Medication: cycloSPORINE (SANDIMMUNE) 100 MG capsule   Has the patient contacted their pharmacy? No, Dr Clelia CroftShaw has never wrote before, when in office he was told to call us   (Agent: If no, request that the patient contact the pharmacy for the refill.)   Preferred Pharmacy (with phone number or street name): [facility] [address]   Agent: Please be advised that RX refills may take up to 3 business days. We ask that you follow-up with your pharmacy.

## 2018-01-09 NOTE — Telephone Encounter (Signed)
Sent refill req to Dr. Clelia CroftShaw.  We have not prescribed cyclosporine 100mg  cap

## 2018-01-11 NOTE — Telephone Encounter (Signed)
I do not remember discussing with pt that I would take over rxing his cyclosporine - sounds like we had a misunderstanding as I would be willing to refill his citalopram/celexa or cardiazem/diltiazem or other meds. But the cyclosporine needs frequently lab monitoring which hasn't been done in our records since 08/2016 and I have no notes from his transplant team. The cyclosporine comes in several different forms which you cannot switch between and I don't know which ones he has been on. And to cap it off, there is a black box warning on cyclosporine stating that it should only be rx'd by providers experienced in immunosuppressant treatment and management (of which I am NOT one) and that physicians responsible for maintenance treat should have all info needed for pt f/u (which I definitely do not - I do not have ANY info from his transplant drs in several years).  In summary, the cyclosprine and cellcept need to be rx'd by his transplant doctors. The rest, I am happy to refills.  If his transplant team needs blood work or testing, they can fax me a note and we would be happy to do in our office and fax back to them if that would be helpful.

## 2018-01-14 NOTE — Telephone Encounter (Signed)
Phone call to patient. Relayed message from provider. Patient is agreeable, verbalizes understanding. He states that he was able to get cellcept and cyclosporine refills from his transplant docs, will be contacting office in a few weeks for refills of other medications.

## 2018-01-22 ENCOUNTER — Ambulatory Visit: Payer: Medicare Other | Admitting: Neurology

## 2018-01-22 ENCOUNTER — Telehealth: Payer: Self-pay | Admitting: Neurology

## 2018-01-22 ENCOUNTER — Encounter: Payer: Self-pay | Admitting: Neurology

## 2018-01-22 DIAGNOSIS — R519 Headache, unspecified: Secondary | ICD-10-CM

## 2018-01-22 DIAGNOSIS — R51 Headache: Secondary | ICD-10-CM | POA: Diagnosis not present

## 2018-01-22 MED ORDER — TOPIRAMATE 100 MG PO TABS: 100.0000 mg | ORAL_TABLET | Freq: Two times a day (BID) | ORAL | 6 refills | Status: DC

## 2018-01-22 MED ORDER — BUTALBITAL-APAP-CAFFEINE 50-325-40 MG PO TABS: 1.0000 | ORAL_TABLET | Freq: Four times a day (QID) | ORAL | 3 refills | Status: DC | PRN

## 2018-01-22 NOTE — Progress Notes (Signed)
PATIENT: Jason Ware DOB: 04-24-1966  Chief Complaint  Patient presents with  . Migraine    Reports having 4-5 migraine days per week. He is currently taking amitriptyline 38m at bedtime but the frequency of his headaches has not improved.  Maxalt works well some of the time, depending on the severity of pain.  He typically has vision changes and occasionally nausea.  .Marland KitchenPCP    SShawnee Knapp MD     HISTORICAL  Jason Rykeris a 52year old male, seen in refer by his primary care doctor SDelman Cheadlefor evaluation of migraine headaches, initial evaluation was on January 22, 2018.  I reviewed and summarized the referring note, he has history of hereditary amyloidosis, status post defibrillator placement, migraine headaches, heart transplant, liver [facility] he presented with congestive heart failure, cardiac arrhythmia, bilateral lower extremity swelling.  His symptoms has much improved after cardiac transplant,  He had long history of migraine headaches, his typical migraine holoacranial severe pounding headache with associated light noise sensitivity, nauseous, his headache worse. He used to have migraine headaches about couple times each months, did not respond to Imitrex, reported mild to moderate improvement with Maxalt.  Since summer 2018, he reported increased frequency of migraine headaches sometimes up to 4-5 times each week, lasting for 1 day, suboptimal response to Maxalt, mild response to Tylenol, he is not a candidate for NSAIDs due to his cardiac condition.  He also reported no blurry vision, intense pain is migraine headaches now, he is now taking amitriptyline 25 mg every night,  Diltiazepm xr180 mg    Laboratory evaluation in September 2018 showed normal C-reactive protein, ESR, mildly decreased WBC 12.9, hemoglobin of 12.5,  He has strong family history of amyloidosis, mother, and multiple maternal uncle and aunt, maternal grandmother suffered  amyloidosis.  REVIEW OF SYSTEMS: Full 14 system review of systems performed and notable only for blurry vision, double vision, loss of vision, eye pain, headaches, dizziness, depression, anxiety, skin sensitivity, insomnia, racing thoughts.  ALLERGIES: No Known Allergies  HOME MEDICATIONS: Current Outpatient Medications  Medication Sig Dispense Refill  . amitriptyline (ELAVIL) 25 MG tablet Take 1 tablet (25 mg total) by mouth at bedtime. 30 tablet 3  . aspirin EC 81 MG tablet Take 81 mg by mouth every morning.    . citalopram (CELEXA) 20 MG tablet Take 30 mg by mouth daily.     . cycloSPORINE (SANDIMMUNE) 100 MG capsule Take 125-150 mg by mouth See admin instructions. 150 mg every morning and 125 mg every evening    . cycloSPORINE (SANDIMMUNE) 25 MG capsule Take 125-250 mg by mouth See admin instructions. Pt takes 25 mg tablet with the 100 mg tablet in the evening [150 mg (1.5 tablets of the 100 mg) is in the morning]    . diltiazem (DILACOR XR) 180 MG 24 hr capsule Take 180 mg by mouth daily.    . magnesium oxide (MAG-OX) 400 MG tablet Take 800 mg by mouth daily.    . mycophenolate (CELLCEPT) 500 MG tablet Take 1,000 mg by mouth 2 (two) times daily.    . pantoprazole (PROTONIX) 40 MG tablet Take 40 mg by mouth daily.    . promethazine (PHENERGAN) 25 MG tablet Take 1 tablet (25 mg total) by mouth every 8 (eight) hours as needed for nausea (or migrain headache. will cause sedation). 20 tablet 2  . rizatriptan (MAXALT) 10 MG tablet Take 1 tablet (10 mg total) by mouth as  needed for migraine. May repeat in 2 hours if needed 9 tablet 11   No current facility-administered medications for this visit.     PAST MEDICAL HISTORY: Past Medical History:  Diagnosis Date  . Amyloidosis ([place])   . Anxiety   . CHF (congestive heart failure) ([place])   . Depression   . ICD (implantable cardioverter-defibrillator) in place   . Migraine     PAST SURGICAL HISTORY: Past Surgical History:  Procedure  Laterality Date  . HEART TRANSPLANT  2013  . HERNIA REPAIR    . LIVER TRANSPLANT  2013    FAMILY HISTORY: Family History  Problem Relation Age of Onset  . Heart disease Mother   . Healthy Father     SOCIAL HISTORY:  Social History   Socioeconomic History  . Marital status: Married    Spouse name: Not on file  . Number of children: 1  . Years of education: college  . Highest education level: Not on file  Social Needs  . Financial resource strain: Not on file  . Food insecurity - worry: Not on file  . Food insecurity - inability: Not on file  . Transportation needs - medical: Not on file  . Transportation needs - non-medical: Not on file  Occupational History  . Occupation: disability  Tobacco Use  . Smoking status: Never Smoker  . Smokeless tobacco: Never Used  Substance and Sexual Activity  . Alcohol use: No  . Drug use: No  . Sexual activity: Not on file  Other Topics Concern  . Not on file  Social History Narrative   Right-handed.   Rare use of caffeine.   Lives at home alone.     PHYSICAL EXAM   Vitals:   01/22/18 0751  BP: 135/83  Pulse: 93  Weight: 175 lb (79.4 kg)  Height: 5' 9.75" (1.772 m)    Not recorded      Body mass index is 25.29 kg/m.  PHYSICAL EXAMNIATION:  Gen: NAD, conversant, well nourised, obese, well groomed                     Cardiovascular: Regular rate rhythm, no peripheral edema, warm, nontender. Eyes: Conjunctivae clear without exudates or hemorrhage Neck: Supple, no carotid bruits. Pulmonary: Clear to auscultation bilaterally   NEUROLOGICAL EXAM:  MENTAL STATUS: Speech:    Speech is normal; fluent and spontaneous with normal comprehension.  Cognition:     Orientation to time, place and person     Normal recent and remote memory     Normal Attention span and concentration     Normal Language, naming, repeating,spontaneous speech     Fund of knowledge   CRANIAL NERVES: CN II: Visual fields are full to  confrontation. Fundoscopic exam is normal with sharp discs and no vascular changes. Pupils are round equal and briskly reactive to light. CN III, IV, VI: extraocular movement are normal. No ptosis. CN V: Facial sensation is intact to pinprick in all 3 divisions bilaterally. Corneal responses are intact.  CN VII: Face is symmetric with normal eye closure and smile. CN VIII: Hearing is normal to rubbing fingers CN IX, X: Palate elevates symmetrically. Phonation is normal. CN XI: Head turning and shoulder shrug are intact CN XII: Tongue is midline with normal movements and no atrophy.  MOTOR: There is no pronator drift of out-stretched arms. Muscle bulk and tone are normal. Muscle strength is normal.  REFLEXES: Reflexes are 2+ and symmetric at the biceps, triceps, knees,  and ankles. Plantar responses are flexor.  SENSORY: Intact to light touch, pinprick, positional sensation and vibratory sensation are intact in fingers and toes.  COORDINATION: Rapid alternating movements and fine finger movements are intact. There is no dysmetria on finger-to-nose and heel-knee-shin.    GAIT/STANCE: Posture is normal. Gait is steady with normal steps, base, arm swing, and turning. Heel and toe walking are normal. Tandem gait is normal.  Romberg is absent.   DIAGNOSTIC DATA (LABS, IMAGING, TESTING) - I reviewed patient records, labs, notes, testing and imaging myself where available.   ASSESSMENT AND PLAN  Delmar Arriaga is a 52 y.o. male   Chronic migraine headaches History of hereditary amyloidosis status post heart, liver transplant,  MRI of the brain without contrast  Add on preventive medication Topamax titrating to 100 mg twice a day  He had suboptimal response to triptans, Imitrex, only mild response to Maxalt,  Will try Fioricet as needed  Marcial Pacas, M.D. Ph.D.  [facility] [address] [place], [place] [address] Ph: [phone] Fax: [phone]  CC:  Shawnee Knapp, MD

## 2018-01-22 NOTE — Telephone Encounter (Signed)
[facility]

## 2018-01-23 DIAGNOSIS — Z7952 Long term (current) use of systemic steroids: Secondary | ICD-10-CM | POA: Diagnosis not present

## 2018-01-23 DIAGNOSIS — Z941 Heart transplant status: Secondary | ICD-10-CM | POA: Diagnosis not present

## 2018-01-28 ENCOUNTER — Ambulatory Visit
Admission: RE | Admit: 2018-01-28 | Discharge: 2018-01-28 | Disposition: A | Discharge status: Home / Self Care | Payer: Medicare Other | Source: Ambulatory Visit | Attending: Neurology | Admitting: Neurology

## 2018-01-28 ENCOUNTER — Telehealth: Payer: Self-pay | Admitting: Family Medicine

## 2018-01-28 ENCOUNTER — Telehealth: Payer: Self-pay | Admitting: Neurology

## 2018-01-28 DIAGNOSIS — R51 Headache: Principal | ICD-10-CM

## 2018-01-28 DIAGNOSIS — R519 Headache, unspecified: Secondary | ICD-10-CM

## 2018-01-28 NOTE — Telephone Encounter (Signed)
Protonix not previously prescribed by Dr. Clelia CroftShaw.  LOV: 11/25/17 for migraines, GERD not addressed   [facility] [facility] [address]

## 2018-01-28 NOTE — Telephone Encounter (Signed)
I cancelled MRI brain, reordered CT head

## 2018-01-28 NOTE — Addendum Note (Signed)
Addended by: Levert FeinsteinYAN, Harlie Ragle on: 01/28/2018 09:53 AM   Modules accepted: Orders

## 2018-01-28 NOTE — Telephone Encounter (Signed)
[facility] because of his defibrillator they would not be able to do his MRI.  Pt is asking to be called

## 2018-01-28 NOTE — Telephone Encounter (Signed)
[employer] Medicare auth: [facility] order sent to [facility] . They will reach out to the patient to schedule.

## 2018-01-28 NOTE — Telephone Encounter (Signed)
Copied from CRM [id number]. Topic: Quick Communication - Rx Refill/Question >> Jan 28, 2018  8:58 AM [name], [name] wrote: Medication: pantoprazole (PROTONIX) 40 MG tablet    Has the patient contacted their pharmacy? Yes.     (Agent: If no, request that the patient contact the pharmacy for the refill.)   Preferred Pharm[facility] (with phone number or street name): [facility] [address]    Agent: Please be advised that RX refills may take up to 3 business days. We ask that you follow-up with your pharmacy.

## 2018-01-29 NOTE — Telephone Encounter (Signed)
Call to pt who states he has been receiving the Protonix from another provider.  Offered to schedule pt with an appt for Dr. Clelia CroftShaw to review meds.  Pt states he will call the other provider for a refill and c/b to office another time to schedule appt for Med Review/Refills.

## 2018-02-07 ENCOUNTER — Ambulatory Visit: Payer: Self-pay | Admitting: *Deleted

## 2018-02-07 ENCOUNTER — Other Ambulatory Visit: Payer: Self-pay

## 2018-02-07 ENCOUNTER — Encounter (HOSPITAL_COMMUNITY): Payer: Self-pay | Admitting: *Deleted

## 2018-02-07 ENCOUNTER — Emergency Department (HOSPITAL_COMMUNITY)
Admission: EM | Admit: 2018-02-07 | Discharge: 2018-02-07 | Disposition: A | Discharge status: Home / Self Care | Payer: Medicare Other | Attending: Emergency Medicine | Admitting: Emergency Medicine

## 2018-02-07 DIAGNOSIS — Z5321 Procedure and treatment not carried out due to patient leaving prior to being seen by health care provider: Secondary | ICD-10-CM | POA: Diagnosis not present

## 2018-02-07 DIAGNOSIS — R51 Headache: Secondary | ICD-10-CM | POA: Diagnosis not present

## 2018-02-07 DIAGNOSIS — R1110 Vomiting, unspecified: Secondary | ICD-10-CM | POA: Insufficient documentation

## 2018-02-07 LAB — COMPREHENSIVE METABOLIC PANEL
ALT: 27 U/L (ref 17–63)
AST: 36 U/L (ref 15–41)
Albumin: 4.5 g/dL (ref 3.5–5.0)
Alkaline Phosphatase: 74 U/L (ref 38–126)
Anion gap: 9 (ref 5–15)
BILIRUBIN TOTAL: 1 mg/dL (ref 0.3–1.2)
BUN: 23 mg/dL — AB (ref 6–20)
CALCIUM: 9.2 mg/dL (ref 8.9–10.3)
CHLORIDE: 107 mmol/L (ref 101–111)
CO2: 24 mmol/L (ref 22–32)
CREATININE: 1.48 mg/dL — AB (ref 0.61–1.24)
GFR, EST NON AFRICAN AMERICAN: 53 mL/min — AB (ref >60)
Glucose, Bld: 93 mg/dL (ref 65–99)
Potassium: 4.3 mmol/L (ref 3.5–5.1)
Sodium: 140 mmol/L (ref 135–145)
Total Protein: 7.2 g/dL (ref 6.5–8.1)

## 2018-02-07 LAB — CBC
HCT: 36.9 % — ABNORMAL LOW (ref 39.0–52.0)
Hemoglobin: 12.4 g/dL — ABNORMAL LOW (ref 13.0–17.0)
MCH: 28.6 pg (ref 26.0–34.0)
MCHC: 33.6 g/dL (ref 30.0–36.0)
MCV: 85.2 fL (ref 78.0–100.0)
PLATELETS: 226 10*3/uL (ref 150–400)
RBC: 4.33 MIL/uL (ref 4.22–5.81)
RDW: 13.8 % (ref 11.5–15.5)
WBC: 3.9 10*3/uL — AB (ref 4.0–10.5)

## 2018-02-07 LAB — LIPASE, BLOOD: LIPASE: 33 U/L (ref 11–51)

## 2018-02-07 NOTE — Telephone Encounter (Signed)
I'm heart and liver transplant pt.   I've sipped a 1/2 cup of water a day maybe.   Just enough to take my medications.   Probably 1 yogurt  a day for the past week.   I have no appetite and don't want to drink anything but my mouth is very dry and I'm feeling dizzy at times.   See below triage notes.  I referred him to the ED especially after telling me he is a heart and liver transplant pt.    I instructed him to tell them he is a transplant pt so they can triage him to a higher level of care.   He verbalized understanding    Reason for Disposition . Patient sounds very sick or weak to the triager  Answer Assessment - Initial Assessment Questions 1. VOMITING SEVERITY: "How many times have you vomited in the past 24 hours?"     - MILD:  1 - 2 times/day    - MODERATE: 3 - 5 times/day, decreased oral intake without significant weight loss or symptoms of dehydration    - SEVERE: 6 or more times/day, vomits everything or nearly everything, with significant weight loss, symptoms of dehydration      Started Monday night into Tuesday morning.  Vomiting and diarrhea. 2. ONSET: "When did the vomiting begin?"      See above 3. FLUIDS: "What fluids or food have you vomited up today?" "Have you been able to keep any fluids down?"     Then Wed felt better then Yesterday intermittent symptoms on and off.   Today I'm feeling really bad.     No vomiting but very nauseas.   No diarrhea today. 4. ABDOMINAL PAIN: "Are your having any abdominal pain?" If yes : "How bad is it and what does it feel like?" (e.g., crampy, dull, intermittent, constant)      A little abd pain.   Like a burning sensation. 5. DIARRHEA: "Is there any diarrhea?" If so, ask: "How many times today?"      Not today. 6. CONTACTS: "Is there anyone else in the family with the same symptoms?"      No 7. CAUSE: "What do you think is causing your vomiting?"     No. 8. HYDRATION STATUS: "Any signs of dehydration?" (e.g., dry mouth [not only dry  lips], too weak to stand) "When did you last urinate?"     I'm not drinking much.  I don't want to drink anything.   Only thing I've eaten this week is yogart.  9. OTHER SYMPTOMS: "Do you have any other symptoms?" (e.g., fever, headache, vertigo, vomiting blood or coffee grounds, recent head injury)     Felt feverish, dizziness. 10. PREGNANCY: "Is there any chance you are pregnant?" "When was your last menstrual period?"       N/A  Protocols used: [facility]

## 2018-02-07 NOTE — Telephone Encounter (Signed)
[facility]

## 2018-02-07 NOTE — ED Triage Notes (Signed)
Pt in c/o n/v/d onset x 5 days with fever, pt c/o anorexia and generalized weakness, pt denies vomiting in the last 24 hrs, pt reports x 3 loose stools in the last 24  Hrs, pt denies CP, SOB, A&O x4

## 2018-02-07 NOTE — Telephone Encounter (Signed)
Good, pt is in the ER now.

## 2018-02-10 ENCOUNTER — Ambulatory Visit
Admission: RE | Admit: 2018-02-10 | Discharge: 2018-02-10 | Disposition: A | Discharge status: Home / Self Care | Payer: Medicare Other | Source: Ambulatory Visit | Attending: Neurology | Admitting: Neurology

## 2018-02-10 ENCOUNTER — Telehealth: Payer: Self-pay | Admitting: Neurology

## 2018-02-10 DIAGNOSIS — R51 Headache: Principal | ICD-10-CM

## 2018-02-10 DIAGNOSIS — R519 Headache, unspecified: Secondary | ICD-10-CM

## 2018-02-10 NOTE — Telephone Encounter (Signed)
Please call patient, CT head contrast showed a small left cerebellum old stroke, no acute abnormality    IMPRESSION: The CT scan of the head without contrast shows the following: 1.   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   No other abnormal foci are noted..  2.   There are no acute findings.

## 2018-02-11 NOTE — Telephone Encounter (Signed)
Spoke to patient - he is aware of the results.  He will continue his prescribed medications and keep his follow up appt.

## 2018-03-13 ENCOUNTER — Encounter: Payer: Self-pay | Admitting: Neurology

## 2018-03-26 DIAGNOSIS — Z941 Heart transplant status: Secondary | ICD-10-CM | POA: Diagnosis not present

## 2018-03-26 DIAGNOSIS — I129 Hypertensive chronic kidney disease with stage 1 through stage 4 chronic kidney disease, or unspecified chronic kidney disease: Secondary | ICD-10-CM | POA: Diagnosis not present

## 2018-03-26 DIAGNOSIS — I340 Nonrheumatic mitral (valve) insufficiency: Secondary | ICD-10-CM | POA: Diagnosis not present

## 2018-03-26 DIAGNOSIS — Z944 Liver transplant status: Secondary | ICD-10-CM | POA: Diagnosis not present

## 2018-03-26 DIAGNOSIS — N183 Chronic kidney disease, stage 3 (moderate): Secondary | ICD-10-CM | POA: Diagnosis not present

## 2018-03-26 DIAGNOSIS — D899 Disorder involving the immune mechanism, unspecified: Secondary | ICD-10-CM | POA: Diagnosis not present

## 2018-03-26 DIAGNOSIS — G43909 Migraine, unspecified, not intractable, without status migrainosus: Secondary | ICD-10-CM | POA: Diagnosis not present

## 2018-03-26 DIAGNOSIS — I361 Nonrheumatic tricuspid (valve) insufficiency: Secondary | ICD-10-CM | POA: Diagnosis not present

## 2018-03-26 DIAGNOSIS — I517 Cardiomegaly: Secondary | ICD-10-CM | POA: Diagnosis not present

## 2018-03-28 DIAGNOSIS — E785 Hyperlipidemia, unspecified: Secondary | ICD-10-CM | POA: Diagnosis not present

## 2018-03-28 DIAGNOSIS — Z4821 Encounter for aftercare following heart transplant: Secondary | ICD-10-CM | POA: Diagnosis not present

## 2018-03-28 DIAGNOSIS — I2510 Atherosclerotic heart disease of native coronary artery without angina pectoris: Secondary | ICD-10-CM | POA: Diagnosis not present

## 2018-03-28 DIAGNOSIS — Z941 Heart transplant status: Secondary | ICD-10-CM | POA: Diagnosis not present

## 2018-03-28 DIAGNOSIS — Z944 Liver transplant status: Secondary | ICD-10-CM | POA: Diagnosis not present

## 2018-03-28 DIAGNOSIS — I10 Essential (primary) hypertension: Secondary | ICD-10-CM | POA: Diagnosis not present

## 2018-03-28 DIAGNOSIS — Z7982 Long term (current) use of aspirin: Secondary | ICD-10-CM | POA: Diagnosis not present

## 2018-03-28 DIAGNOSIS — Z0389 Encounter for observation for other suspected diseases and conditions ruled out: Secondary | ICD-10-CM | POA: Diagnosis not present

## 2018-04-10 ENCOUNTER — Encounter: Payer: Self-pay | Admitting: Family Medicine

## 2018-04-10 ENCOUNTER — Other Ambulatory Visit: Payer: Self-pay

## 2018-04-10 ENCOUNTER — Ambulatory Visit (INDEPENDENT_AMBULATORY_CARE_PROVIDER_SITE_OTHER): Payer: Medicare Other | Admitting: Family Medicine

## 2018-04-10 VITALS — BP 108/80 | HR 92 | Temp 98.2°F | Resp 18 | Ht 69.0 in | Wt 174.4 lb

## 2018-04-10 DIAGNOSIS — R002 Palpitations: Secondary | ICD-10-CM

## 2018-04-10 DIAGNOSIS — N183 Chronic kidney disease, stage 3 unspecified: Secondary | ICD-10-CM

## 2018-04-10 DIAGNOSIS — E538 Deficiency of other specified B group vitamins: Secondary | ICD-10-CM

## 2018-04-10 DIAGNOSIS — R252 Cramp and spasm: Secondary | ICD-10-CM | POA: Diagnosis not present

## 2018-04-10 DIAGNOSIS — R5383 Other fatigue: Secondary | ICD-10-CM | POA: Diagnosis not present

## 2018-04-10 NOTE — Progress Notes (Addendum)
Subjective:  By signing my name below, I, [name], attest that this documentation has been prepared under the direction and in the presence of [name], MD. Electronically Signed: [name], Scribe. 04/10/2018 , 4:12 PM .  Patient was seen in Room 3 .   Patient ID: Jason Ware, male    DOB: 27-Dec-1965, 52 y.o.   MRN: [id number] Chief Complaint  Patient presents with  . Heart Monitor    Pt states he was given an Rx for a heart monitor and was told that his PCP would give it to him. Pt states he had EKG done at heart doc.   HPI Jason Ware is a 52 y.o. male who presents to Primary Care at [facility] requesting heart monitor. He was given prescription for one by his cardiologist because he was having some palpitations. He describes palpitations occurring intermittently, where it causes him to feel lightheaded and dizziness. It comes on instantaneous and then goes. In the past 8-9 days, he's felt fatigued and worn out, with decreased energy. He also notes some intermittent cramping in his legs for about past 4 months, but worsened in the past week.   Past Medical History:  Diagnosis Date  . Amyloidosis (HCC)   . Anxiety   . CHF (congestive heart failure) (HCC)   . Depression   . ICD (implantable cardioverter-defibrillator) in place   . Migraine    Past Surgical History:  Procedure Laterality Date  . HEART TRANSPLANT  2013  . HERNIA REPAIR    . LIVER TRANSPLANT  2013   Prior to Admission medications   Medication Sig Start Date End Date Taking? Authorizing Provider  amitriptyline (ELAVIL) 25 MG tablet Take 1 tablet (25 mg total) by mouth at bedtime. 11/25/17  Yes Sherren Mocha, MD  aspirin EC 81 MG tablet Take 81 mg by mouth every morning.   Yes [provider]  butalbital-acetaminophen-caffeine (FIORICET, ESGIC) 50-325-40 MG tablet Take 1 tablet by mouth every 6 (six) hours as needed for headache. Do not refill in less than 30 days 01/22/18  Yes Levert Feinstein, MD    citalopram (CELEXA) 20 MG tablet Take 30 mg by mouth daily.    Yes [provider]  cycloSPORINE (SANDIMMUNE) 100 MG capsule Take 125-150 mg by mouth See admin instructions. 150 mg every morning and 125 mg every evening   Yes [provider]  cycloSPORINE (SANDIMMUNE) 25 MG capsule Take 125-250 mg by mouth See admin instructions. Pt takes 25 mg tablet with the 100 mg tablet in the evening [150 mg (1.5 tablets of the 100 mg) is in the morning]   Yes [provider]  diltiazem (DILACOR XR) 180 MG 24 hr capsule Take 180 mg by mouth daily.   Yes [provider]  magnesium oxide (MAG-OX) 400 MG tablet Take 800 mg by mouth daily.   Yes [provider]  mycophenolate (CELLCEPT) 500 MG tablet Take 1,000 mg by mouth 2 (two) times daily.   Yes [provider]  pantoprazole (PROTONIX) 40 MG tablet Take 40 mg by mouth daily.   Yes [provider]  promethazine (PHENERGAN) 25 MG tablet Take 1 tablet (25 mg total) by mouth every 8 (eight) hours as needed for nausea (or migrain headache. will cause sedation). 11/25/17  Yes Sherren Mocha, MD  rizatriptan (MAXALT) 10 MG tablet Take 1 tablet (10 mg total) by mouth as needed for migraine. May repeat in 2 hours if needed 11/25/17  Yes Clelia Croft,  Levell July, MD  topiramate (TOPAMAX) 100 MG tablet Take 1 tablet (100 mg total) by mouth 2 (two) times daily. 01/22/18  Yes Levert Feinstein, MD   No Known Allergies Family History  Problem Relation Age of Onset  . Heart disease Mother   . Healthy Father    Social History   Socioeconomic History  . Marital status: Married    Spouse name: Not on file  . Number of children: 1  . Years of education: college  . Highest education level: Not on file  Occupational History  . Occupation: disability  Social Needs  . Financial resource strain: Not on file  . Food insecurity:    Worry: Not on file    Inability: Not on file  . Transportation needs:    Medical: Not on file     Non-medical: Not on file  Tobacco Use  . Smoking status: Never Smoker  . Smokeless tobacco: Never Used  Substance and Sexual Activity  . Alcohol use: No  . Drug use: No  . Sexual activity: Not on file  Lifestyle  . Physical activity:    Days per week: Not on file    Minutes per session: Not on file  . Stress: Not on file  Relationships  . Social connections:    Talks on phone: Not on file    Gets together: Not on file    Attends religious service: Not on file    Active member of club or organization: Not on file    Attends meetings of clubs or organizations: Not on file    Relationship status: Not on file  Other Topics Concern  . Not on file  Social History Narrative   Right-handed.   Rare use of caffeine.   Lives at home alone.   Depression screen [facility] 2/9 04/10/2018 07/20/2017  Decreased Interest 0 0  Down, Depressed, Hopeless 0 0  PHQ - 2 Score 0 0    Review of Systems  Constitutional: Negative for fatigue and unexpected weight change.  Eyes: Negative for visual disturbance.  Respiratory: Negative for cough, chest tightness and shortness of breath.   Cardiovascular: Positive for palpitations. Negative for chest pain and leg swelling.  Gastrointestinal: Negative for abdominal pain and blood in stool.  Neurological: Negative for dizziness, light-headedness and headaches.       Objective:   Physical Exam  Constitutional: He is oriented to person, place, and time. He appears well-developed and well-nourished. No distress.  HENT:  Head: Normocephalic and atraumatic.  Eyes: Pupils are equal, round, and reactive to light. EOM are normal.  Neck: Neck supple.  Cardiovascular: Normal rate.  Pulmonary/Chest: Effort normal. No respiratory distress.  Musculoskeletal: Normal range of motion.  Neurological: He is alert and oriented to person, place, and time.  Skin: Skin is warm and dry.  Psychiatric: He has a normal mood and affect. His behavior is normal.  Nursing note and  vitals reviewed.   BP 108/80 (BP Location: Left Arm, Patient Position: Sitting, Cuff Size: Normal)   Pulse 92   Temp 98.2 F (36.8 C) (Oral)   Resp 18   Ht  (1.753 m)   Wt 174 lb 6.4 oz (79.1 kg)   SpO2 96%   BMI 25.75 kg/m      Assessment & Plan:   1. Palpitations - Please send results of Holter to Dr. Diona Fanti at [phone] whom pt brought in rx from for 48 hr holter monitor - copy of rx scanned in -pt still  has original - Dr. Diona Fanti is his cardiologist at [facility] where he received his double transplant sev yrs ago and continues to be followed. Pt reports Dr. Diona Fanti did EKG while he was seeing her for these complaints and did some bloodwork but he is not sure exactly what - was all unrevealing since he was asymptomatic.  Advised pt that if he does not have at least sev episodes of sxs during the 48 hrs on the monitor (since occurences are freq but random) to call immed so we can get him changed to an event monitor.  2. Fatigue, unspecified type - for >1 wk - check labs. RTC if cont/worsening  3. Cramps of lower extremity - not on diuretic or statin, poss dehydration from heat? No med changes recently - check labs, push fluids, cont mag supp    Orders Placed This Encounter  Procedures  . TSH+T4F+T3Free  . Vitamin B12  . Comprehensive metabolic panel  . CBC with Differential/Platelet  . Magnesium  . Phosphorus  . Ferritin  . CK  . Sedimentation Rate  . Holter monitor - 48 hour    Standing Status:   Future    Standing Expiration Date:   04/10/2028    Scheduling Instructions:     Please send results to Dr. Clois Dupes at [phone]    Order Specific Question:   Where should this test be performed?    Answer:   [facility]    Addendum: cpk mildly elevated at 400 but wouldn't start worrying about rhabdo till >1000 minimum.  However 10% of people can have cramping with cyclosporine yet pt's dose was decreased by  daily several mos ago - ensure no recent  increase in ppi or dilt, or otc meds/supp (grapefruit, E, st johs, red yeast rie) that could increase levels  Advised pt to come by office for nurse only visit for B12 shot as low vit B12 on labs and lab only visit for urinalysis - will send out as otherwise does not come to my inbox for review.  I personally performed the services described in this documentation, which was scribed in my presence. The recorded information has been reviewed and considered, and addended by me as needed.   [name], M.D.  Primary Care at [facility] [address] [place], [place] [address] [phone] phone [phone] fax  04/11/18 9:09 AM

## 2018-04-10 NOTE — Patient Instructions (Addendum)
IF you received an x-ray today, you will receive an invoice from [facility]. Please contact [facility] at [phone] with questions or concerns regarding your invoice.   IF you received labwork today, you will receive an invoice from [facility]. Please contact [facility] at [phone] with questions or concerns regarding your invoice.   Our billing staff will not be able to assist you with questions regarding bills from these companies.  You will be contacted with the lab results as soon as they are available. The fastest way to get your results is to activate your My Chart account. Instructions are located on the last page of this paperwork. If you have not heard from [place] regarding the results in 2 weeks, please contact this office.     Fatigue Fatigue is feeling tired all of the time, a lack of energy, or a lack of motivation. Occasional or mild fatigue is often a normal response to activity or life in general. However, long-lasting (chronic) or extreme fatigue may indicate an underlying medical condition. Follow these instructions at home: Watch your fatigue for any changes. The following actions may help to lessen any discomfort you are feeling:  Talk to your health care provider about how much sleep you need each night. Try to get the required amount every night.  Take medicines only as directed by your health care provider.  Eat a healthy and nutritious diet. Ask your health care provider if you need help changing your diet.  Drink enough fluid to keep your urine clear or pale yellow.  Practice ways of relaxing, such as yoga, meditation, massage therapy, or acupuncture.  Exercise regularly.  Change situations that cause you stress. Try to keep your work and personal routine reasonable.  Do not abuse illegal drugs.  Limit alcohol intake to no more than 1 drink per day for nonpregnant women and 2 drinks per day for men. One drink equals 12 ounces of beer,  5 ounces of wine, or 1 ounces of hard liquor.  Take a multivitamin, if directed by your health care provider.  Contact a health care provider if:  Your fatigue does not get better.  You have a fever.  You have unintentional weight loss or gain.  You have headaches.  You have difficulty: ? Falling asleep. ? Sleeping throughout the night.  You feel angry, guilty, anxious, or sad.  You are unable to have a bowel movement (constipation).  You skin is dry.  Your legs or another part of your body is swollen. Get help right away if:  You feel confused.  Your vision is blurry.  You feel faint or pass out.  You have a severe headache.  You have severe abdominal, pelvic, or back pain.  You have chest pain, shortness of breath, or an irregular or fast heartbeat.  You are unable to urinate or you urinate less than normal.  You develop abnormal bleeding, such as bleeding from the rectum, vagina, nose, lungs, or nipples.  You vomit blood.  You have thoughts about harming yourself or committing suicide.  You are worried that you might harm someone else. This information is not intended to replace advice given to you by your health care provider. Make sure you discuss any questions you have with your health care provider. Document Released: 08/26/2007 Document Revised: 04/05/2016 Document Reviewed: 03/02/2014 Elsevier Interactive Patient Education  2018 Elsevier Inc. Leg Cramps Leg cramps occur when a muscle or muscles tighten and you have no control over this  tightening (involuntary muscle contraction). Muscle cramps can develop in any muscle, but the most common place is in the calf muscles of the leg. Those cramps can occur during exercise or when you are at rest. Leg cramps are painful, and they may last for a few seconds to a few minutes. Cramps may return several times before they finally stop. Usually, leg cramps are not caused by a serious medical problem. In many  cases, the cause is not known. Some common causes include:  Overexertion.  Overuse from repetitive motions, or doing the same thing over and over.  Remaining in a certain position for a long period of time.  Improper preparation, form, or technique while performing a sport or an activity.  Dehydration.  Injury.  Side effects of some medicines.  Abnormally low levels of the salts and ions in your blood (electrolytes), especially potassium and calcium. These levels could be low if you are taking water pills (diuretics) or if you are pregnant.  Follow these instructions at home: Watch your condition for any changes. Taking the following actions may help to lessen any discomfort that you are feeling:  Stay well-hydrated. Drink enough fluid to keep your urine clear or pale yellow.  Try massaging, stretching, and relaxing the affected muscle. Do this for several minutes at a time.  For tight or tense muscles, use a warm towel, heating pad, or hot shower water directed to the affected area.  If you are sore or have pain after a cramp, applying ice to the affected area may relieve discomfort. ? Put ice in a plastic bag. ? Place a towel between your skin and the bag. ? Leave the ice on for 20 minutes, 2-3 times per day.  Avoid strenuous exercise for several days if you have been having frequent leg cramps.  Make sure that your diet includes the essential minerals for your muscles to work normally.  Take medicines only as directed by your health care provider.  Contact a health care provider if:  Your leg cramps get more severe or more frequent, or they do not improve over time.  Your foot becomes cold, numb, or blue. This information is not intended to replace advice given to you by your health care provider. Make sure you discuss any questions you have with your health care provider. Document Released: 12/06/2004 Document Revised: 04/05/2016 Document Reviewed: 10/06/2014 Elsevier  Interactive Patient Education  [employer].

## 2018-04-11 DIAGNOSIS — N183 Chronic kidney disease, stage 3 unspecified: Secondary | ICD-10-CM | POA: Insufficient documentation

## 2018-04-11 DIAGNOSIS — E538 Deficiency of other specified B group vitamins: Secondary | ICD-10-CM | POA: Insufficient documentation

## 2018-04-11 LAB — COMPREHENSIVE METABOLIC PANEL
A/G RATIO: 2 (ref 1.2–2.2)
ALK PHOS: 70 IU/L (ref 39–117)
ALT: 21 IU/L (ref 0–44)
AST: 35 IU/L (ref 0–40)
Albumin: 5.1 g/dL (ref 3.5–5.5)
BILIRUBIN TOTAL: 1.2 mg/dL (ref 0.0–1.2)
BUN / CREAT RATIO: 17 (ref 9–20)
BUN: 27 mg/dL — ABNORMAL HIGH (ref 6–24)
CHLORIDE: 103 mmol/L (ref 96–106)
CO2: 22 mmol/L (ref 20–29)
Calcium: 9.9 mg/dL (ref 8.7–10.2)
Creatinine, Ser: 1.63 mg/dL — ABNORMAL HIGH (ref 0.76–1.27)
GFR calc Af Amer: 56 mL/min/{1.73_m2} — ABNORMAL LOW (ref >59)
GFR calc non Af Amer: 48 mL/min/{1.73_m2} — ABNORMAL LOW (ref >59)
Globulin, Total: 2.6 g/dL (ref 1.5–4.5)
Glucose: 80 mg/dL (ref 65–99)
POTASSIUM: 4.6 mmol/L (ref 3.5–5.2)
Sodium: 140 mmol/L (ref 134–144)
TOTAL PROTEIN: 7.7 g/dL (ref 6.0–8.5)

## 2018-04-11 LAB — CBC WITH DIFFERENTIAL/PLATELET
BASOS ABS: 0 10*3/uL (ref 0.0–0.2)
Basos: 0 %
EOS (ABSOLUTE): 0.1 10*3/uL (ref 0.0–0.4)
Eos: 1 %
Hematocrit: 41.1 % (ref 37.5–51.0)
Hemoglobin: 13.5 g/dL (ref 13.0–17.7)
Immature Grans (Abs): 0 10*3/uL (ref 0.0–0.1)
Immature Granulocytes: 0 %
LYMPHS ABS: 1 10*3/uL (ref 0.7–3.1)
Lymphs: 24 %
MCH: 28.9 pg (ref 26.6–33.0)
MCHC: 32.8 g/dL (ref 31.5–35.7)
MCV: 88 fL (ref 79–97)
MONOS ABS: 0.5 10*3/uL (ref 0.1–0.9)
Monocytes: 12 %
NEUTROS ABS: 2.7 10*3/uL (ref 1.4–7.0)
Neutrophils: 63 %
PLATELETS: 246 10*3/uL (ref 150–450)
RBC: 4.67 x10E6/uL (ref 4.14–5.80)
RDW: 14.5 % (ref 12.3–15.4)
WBC: 4.3 10*3/uL (ref 3.4–10.8)

## 2018-04-11 LAB — TSH+T4F+T3FREE
Free T4: 1.25 ng/dL (ref 0.82–1.77)
T3 FREE: 2.4 pg/mL (ref 2.0–4.4)
TSH: 2.15 u[IU]/mL (ref 0.450–4.500)

## 2018-04-11 LAB — SEDIMENTATION RATE: SED RATE: 2 mm/h (ref 0–30)

## 2018-04-11 LAB — MAGNESIUM: MAGNESIUM: 2.1 mg/dL (ref 1.6–2.3)

## 2018-04-11 LAB — VITAMIN B12: VITAMIN B 12: 269 pg/mL (ref 232–1245)

## 2018-04-11 LAB — CK: Total CK: 394 U/L — ABNORMAL HIGH (ref 24–204)

## 2018-04-11 LAB — PHOSPHORUS: Phosphorus: 3.1 mg/dL (ref 2.5–4.5)

## 2018-04-11 LAB — FERRITIN: Ferritin: 77 ng/mL (ref 30–400)

## 2018-04-11 MED ORDER — CYANOCOBALAMIN 1000 MCG/ML IJ SOLN: 1000.0000 ug | Freq: Once | INTRAMUSCULAR | Status: DC

## 2018-04-14 ENCOUNTER — Ambulatory Visit: Payer: Medicare Other | Admitting: Neurology

## 2018-04-14 ENCOUNTER — Encounter: Payer: Self-pay | Admitting: Neurology

## 2018-04-14 VITALS — BP 128/84 | HR 80 | Ht 69.0 in | Wt 177.0 lb

## 2018-04-14 DIAGNOSIS — G43109 Migraine with aura, not intractable, without status migrainosus: Secondary | ICD-10-CM | POA: Diagnosis not present

## 2018-04-14 NOTE — Progress Notes (Signed)
PATIENT: Jason Ware DOB: 05-Jul-1966  Chief Complaint  Patient presents with  . Migraine    He would like to review his CT head results.  He is now taking Topamax 113m BID but he is still having 3-4 headache days per week.  Maxalt and Fioricet work well for his pain.     HISTORICAL  Jason BHAGATis a 52year old male, seen in refer by his primary care doctor SDelman Cheadlefor evaluation of migraine headaches, initial evaluation was on January 22, 2018.  I reviewed and summarized the referring note, he has history of hereditary amyloidosis, status post defibrillator placement, migraine headaches, heart transplant, [facility] he presented with congestive heart failure, cardiac arrhythmia, bilateral lower extremity swelling.  His symptoms has much improved after cardiac transplant,  He had long history of migraine headaches, his typical migraine holoacranial severe pounding headache with associated light noise sensitivity, nauseous, his headache worse. He used to have migraine headaches about couple times each months, did not respond to Imitrex, reported mild to moderate improvement with Maxalt.  Since summer 2018, he reported increased frequency of migraine headaches sometimes up to 4-5 times each week, lasting for 1 day, suboptimal response to Maxalt, mild response to Tylenol, he is not a candidate for NSAIDs due to his cardiac condition.  He also reported no blurry vision, intense pain is migraine headaches now, he is now taking amitriptyline 25 mg every night,  Diltiazepm xr180 mg    Laboratory evaluation in September 2018 showed normal C-reactive protein, ESR, mildly decreased WBC 12.9, hemoglobin of 12.5,  He has strong family history of amyloidosis, mother, and multiple maternal uncle and aunt, maternal grandmother suffered amyloidosis.  UPDATE April 14 2018: He is able to tolerate Topamax 100 mg twice daily, but continue have 3 or 4 headaches each week,  less severe, Maxalt and Fioricet works well for his headache,  CT head without contrast in April 2019 showed no acute abnormality  REVIEW OF SYSTEMS: Full 14 system review of systems performed and notable only for fatigue, headaches, muscle cramps, restless leg, insomnia, nausea, nervousness ALLERGIES: No Known Allergies  HOME MEDICATIONS: Current Outpatient Medications  Medication Sig Dispense Refill  . amitriptyline (ELAVIL) 25 MG tablet Take 1 tablet (25 mg total) by mouth at bedtime. 30 tablet 3  . aspirin EC 81 MG tablet Take 81 mg by mouth every morning.    . butalbital-acetaminophen-caffeine (FIORICET, ESGIC) 50-325-40 MG tablet Take 1 tablet by mouth every 6 (six) hours as needed for headache. Do not refill in less than 30 days 12 tablet 3  . citalopram (CELEXA) 20 MG tablet Take 30 mg by mouth daily.     . cycloSPORINE modified (NEORAL) 100 MG capsule Take 100 mg by mouth daily before breakfast.  3  . cycloSPORINE modified (NEORAL) 25 MG capsule Take 75 mg by mouth at bedtime.  3  . diltiazem (DILACOR XR) 180 MG 24 hr capsule Take 180 mg by mouth daily.    . magnesium oxide (MAG-OX) 400 MG tablet Take 800 mg by mouth daily.    . mycophenolate (CELLCEPT) 500 MG tablet Take 1,000 mg by mouth 2 (two) times daily.    . pantoprazole (PROTONIX) 40 MG tablet Take 40 mg by mouth daily.    . promethazine (PHENERGAN) 25 MG tablet Take 1 tablet (25 mg total) by mouth every 8 (eight) hours as needed for nausea (or migrain headache. will cause sedation). 20 tablet 2  .  rizatriptan (MAXALT) 10 MG tablet Take 1 tablet (10 mg total) by mouth as needed for migraine. May repeat in 2 hours if needed 9 tablet 11  . topiramate (TOPAMAX) 100 MG tablet Take 1 tablet (100 mg total) by mouth 2 (two) times daily. 60 tablet 6   Current Facility-Administered Medications  Medication Dose Route Frequency Provider Last Rate Last Dose  . cyanocobalamin ((VITAMIN B-12)) injection 1,000 mcg  1,000 mcg  Intramuscular Once Shawnee Knapp, MD        PAST MEDICAL HISTORY: Past Medical History:  Diagnosis Date  . Amyloidosis ([place])   . Anxiety   . CHF (congestive heart failure) ([place])   . Depression   . ICD (implantable cardioverter-defibrillator) in place   . Migraine     PAST SURGICAL HISTORY: Past Surgical History:  Procedure Laterality Date  . HEART TRANSPLANT  2013  . HERNIA REPAIR    . LIVER TRANSPLANT  2013    FAMILY HISTORY: Family History  Problem Relation Age of Onset  . Heart disease Mother   . Healthy Father     SOCIAL HISTORY:  Social History   Socioeconomic History  . Marital status: Married    Spouse name: Not on file  . Number of children: 1  . Years of education: college  . Highest education level: Not on file  Occupational History  . Occupation: disability  Social Needs  . Financial resource strain: Not on file  . Food insecurity:    Worry: Not on file    Inability: Not on file  . Transportation needs:    Medical: Not on file    Non-medical: Not on file  Tobacco Use  . Smoking status: Never Smoker  . Smokeless tobacco: Never Used  Substance and Sexual Activity  . Alcohol use: No  . Drug use: No  . Sexual activity: Not on file  Lifestyle  . Physical activity:    Days per week: Not on file    Minutes per session: Not on file  . Stress: Not on file  Relationships  . Social connections:    Talks on phone: Not on file    Gets together: Not on file    Attends religious service: Not on file    Active member of club or organization: Not on file    Attends meetings of clubs or organizations: Not on file    Relationship status: Not on file  . Intimate partner violence:    Fear of current or ex partner: Not on file    Emotionally abused: Not on file    Physically abused: Not on file    Forced sexual activity: Not on file  Other Topics Concern  . Not on file  Social History Narrative   Right-handed.   Rare use of caffeine.   Lives at home  alone.     PHYSICAL EXAM   Vitals:   04/14/18 1115  BP: 128/84  Pulse: 80  Weight: 177 lb (80.3 kg)  Height: '5\' 9"'  (1.753 m)    Not recorded      Body mass index is 26.14 kg/m.  PHYSICAL EXAMNIATION:  Gen: NAD, conversant, well nourised, obese, well groomed                     Cardiovascular: Regular rate rhythm, no peripheral edema, warm, nontender. Eyes: Conjunctivae clear without exudates or hemorrhage Neck: Supple, no carotid bruits. Pulmonary: Clear to auscultation bilaterally   NEUROLOGICAL EXAM:  MENTAL STATUS: Speech:  Speech is normal; fluent and spontaneous with normal comprehension.  Cognition:     Orientation to time, place and person     Normal recent and remote memory     Normal Attention span and concentration     Normal Language, naming, repeating,spontaneous speech     Fund of knowledge   CRANIAL NERVES: CN II: Visual fields are full to confrontation. Fundoscopic exam is normal with sharp discs and no vascular changes. Pupils are round equal and briskly reactive to light. CN III, IV, VI: extraocular movement are normal. No ptosis. CN V: Facial sensation is intact to pinprick in all 3 divisions bilaterally. Corneal responses are intact.  CN VII: Face is symmetric with normal eye closure and smile. CN VIII: Hearing is normal to rubbing fingers CN IX, X: Palate elevates symmetrically. Phonation is normal. CN XI: Head turning and shoulder shrug are intact CN XII: Tongue is midline with normal movements and no atrophy.  MOTOR: There is no pronator drift of out-stretched arms. Muscle bulk and tone are normal. Muscle strength is normal.  REFLEXES: Reflexes are 2+ and symmetric at the biceps, triceps, knees, and ankles. Plantar responses are flexor.  SENSORY: Intact to light touch, pinprick, positional sensation and vibratory sensation are intact in fingers and toes.  COORDINATION: Rapid alternating movements and fine finger movements are intact.  There is no dysmetria on finger-to-nose and heel-knee-shin.    GAIT/STANCE: Posture is normal. Gait is steady with normal steps, base, arm swing, and turning. Heel and toe walking are normal. Tandem gait is normal.  Romberg is absent.   DIAGNOSTIC DATA (LABS, IMAGING, TESTING) - I reviewed patient records, labs, notes, testing and imaging myself where available.   ASSESSMENT AND PLAN  MAVEN VARELAS is a 52 y.o. male   Chronic migraine headaches History of hereditary amyloidosis status post heart, liver transplant,  MRI of the brain without contrast  Continue preventive medication Topamax titrating to 100 mg twice a day  He still has frequent headaches despite polypharmacy treatment, this including Topamax 100 mg twice a day, amitriptyline 25 mg every night, Celexa, calcium channel blocker diltiazem,  Botox injection as migraine prevention  Marcial Pacas, M.D. Ph.D.  [facility] [address] [place], [place] [address] Ph: [phone] Fax: [phone]  CC: Shawnee Knapp, MD

## 2018-04-23 ENCOUNTER — Ambulatory Visit (INDEPENDENT_AMBULATORY_CARE_PROVIDER_SITE_OTHER): Payer: Medicare Other

## 2018-04-23 DIAGNOSIS — R002 Palpitations: Secondary | ICD-10-CM | POA: Diagnosis not present

## 2018-04-30 ENCOUNTER — Ambulatory Visit: Payer: Medicare Other | Admitting: Neurology

## 2018-06-17 ENCOUNTER — Encounter: Payer: Self-pay | Admitting: Family Medicine

## 2018-06-17 DIAGNOSIS — R002 Palpitations: Secondary | ICD-10-CM | POA: Diagnosis not present

## 2018-06-17 DIAGNOSIS — Z941 Heart transplant status: Secondary | ICD-10-CM | POA: Diagnosis not present

## 2018-06-17 DIAGNOSIS — Z4821 Encounter for aftercare following heart transplant: Secondary | ICD-10-CM | POA: Diagnosis not present

## 2018-06-17 DIAGNOSIS — N183 Chronic kidney disease, stage 3 (moderate): Secondary | ICD-10-CM | POA: Diagnosis not present

## 2018-06-17 DIAGNOSIS — I129 Hypertensive chronic kidney disease with stage 1 through stage 4 chronic kidney disease, or unspecified chronic kidney disease: Secondary | ICD-10-CM | POA: Diagnosis not present

## 2019-01-28 ENCOUNTER — Other Ambulatory Visit: Payer: Self-pay

## 2019-01-28 DIAGNOSIS — Z9489 Other transplanted organ and tissue status: Secondary | ICD-10-CM

## 2019-01-28 DIAGNOSIS — N183 Chronic kidney disease, stage 3 unspecified: Secondary | ICD-10-CM

## 2019-01-30 ENCOUNTER — Ambulatory Visit (INDEPENDENT_AMBULATORY_CARE_PROVIDER_SITE_OTHER): Payer: Medicare Other | Admitting: Family Medicine

## 2019-01-30 ENCOUNTER — Other Ambulatory Visit: Payer: Self-pay

## 2019-01-30 DIAGNOSIS — N183 Chronic kidney disease, stage 3 unspecified: Secondary | ICD-10-CM

## 2019-01-30 NOTE — Progress Notes (Signed)
Nurse visit only

## 2019-01-31 LAB — CBC WITH DIFFERENTIAL/PLATELET
BASOS: 0 %
Basophils Absolute: 0 10*3/uL (ref 0.0–0.2)
EOS (ABSOLUTE): 0 10*3/uL (ref 0.0–0.4)
EOS: 1 %
HEMATOCRIT: 38.2 % (ref 37.5–51.0)
Hemoglobin: 12.9 g/dL — ABNORMAL LOW (ref 13.0–17.7)
IMMATURE GRANS (ABS): 0 10*3/uL (ref 0.0–0.1)
IMMATURE GRANULOCYTES: 0 %
Lymphocytes Absolute: 1.1 10*3/uL (ref 0.7–3.1)
Lymphs: 33 %
MCH: 29.7 pg (ref 26.6–33.0)
MCHC: 33.8 g/dL (ref 31.5–35.7)
MCV: 88 fL (ref 79–97)
MONOS ABS: 0.4 10*3/uL (ref 0.1–0.9)
Monocytes: 12 %
NEUTROS PCT: 54 %
Neutrophils Absolute: 1.8 10*3/uL (ref 1.4–7.0)
Platelets: 232 10*3/uL (ref 150–450)
RBC: 4.35 x10E6/uL (ref 4.14–5.80)
RDW: 13.6 % (ref 11.6–15.4)
WBC: 3.4 10*3/uL (ref 3.4–10.8)

## 2019-01-31 LAB — COMPREHENSIVE METABOLIC PANEL
ALT: 15 IU/L (ref 0–44)
AST: 24 IU/L (ref 0–40)
Albumin/Globulin Ratio: 2.1 (ref 1.2–2.2)
Albumin: 4.5 g/dL (ref 3.8–4.9)
Alkaline Phosphatase: 70 IU/L (ref 39–117)
BUN/Creatinine Ratio: 15 (ref 9–20)
BUN: 27 mg/dL — ABNORMAL HIGH (ref 6–24)
Bilirubin Total: 0.7 mg/dL (ref 0.0–1.2)
CALCIUM: 9.5 mg/dL (ref 8.7–10.2)
CO2: 21 mmol/L (ref 20–29)
CREATININE: 1.75 mg/dL — AB (ref 0.76–1.27)
Chloride: 104 mmol/L (ref 96–106)
GFR calc Af Amer: 51 mL/min/{1.73_m2} — ABNORMAL LOW (ref >59)
GFR, EST NON AFRICAN AMERICAN: 44 mL/min/{1.73_m2} — AB (ref >59)
GLOBULIN, TOTAL: 2.1 g/dL (ref 1.5–4.5)
Glucose: 92 mg/dL (ref 65–99)
Potassium: 4.8 mmol/L (ref 3.5–5.2)
SODIUM: 141 mmol/L (ref 134–144)
TOTAL PROTEIN: 6.6 g/dL (ref 6.0–8.5)

## 2019-01-31 LAB — CYCLOSPORINE: Cyclosporine, LC/MS: 117 ng/mL (ref 100–400)

## 2019-01-31 LAB — MAGNESIUM: Magnesium: 1.9 mg/dL (ref 1.6–2.3)

## 2019-03-20 DIAGNOSIS — Z941 Heart transplant status: Secondary | ICD-10-CM | POA: Diagnosis not present

## 2019-03-24 DIAGNOSIS — N183 Chronic kidney disease, stage 3 (moderate): Secondary | ICD-10-CM | POA: Diagnosis not present

## 2019-03-24 DIAGNOSIS — I129 Hypertensive chronic kidney disease with stage 1 through stage 4 chronic kidney disease, or unspecified chronic kidney disease: Secondary | ICD-10-CM | POA: Diagnosis not present

## 2019-03-24 DIAGNOSIS — R002 Palpitations: Secondary | ICD-10-CM | POA: Diagnosis not present

## 2019-03-24 DIAGNOSIS — Z941 Heart transplant status: Secondary | ICD-10-CM | POA: Diagnosis not present

## 2019-03-24 DIAGNOSIS — D899 Disorder involving the immune mechanism, unspecified: Secondary | ICD-10-CM | POA: Diagnosis not present

## 2020-07-19 DIAGNOSIS — Z944 Liver transplant status: Secondary | ICD-10-CM | POA: Diagnosis not present

## 2020-07-19 DIAGNOSIS — I4510 Unspecified right bundle-branch block: Secondary | ICD-10-CM | POA: Diagnosis not present

## 2020-07-19 DIAGNOSIS — G43909 Migraine, unspecified, not intractable, without status migrainosus: Secondary | ICD-10-CM | POA: Diagnosis not present

## 2020-07-19 DIAGNOSIS — R002 Palpitations: Secondary | ICD-10-CM | POA: Diagnosis not present

## 2020-07-19 DIAGNOSIS — R0602 Shortness of breath: Secondary | ICD-10-CM | POA: Diagnosis not present

## 2020-07-19 DIAGNOSIS — I129 Hypertensive chronic kidney disease with stage 1 through stage 4 chronic kidney disease, or unspecified chronic kidney disease: Secondary | ICD-10-CM | POA: Diagnosis not present

## 2020-07-19 DIAGNOSIS — D849 Immunodeficiency, unspecified: Secondary | ICD-10-CM | POA: Diagnosis not present

## 2020-07-19 DIAGNOSIS — Z941 Heart transplant status: Secondary | ICD-10-CM | POA: Diagnosis not present

## 2020-07-19 DIAGNOSIS — N189 Chronic kidney disease, unspecified: Secondary | ICD-10-CM | POA: Diagnosis not present

## 2020-07-19 DIAGNOSIS — E785 Hyperlipidemia, unspecified: Secondary | ICD-10-CM | POA: Diagnosis not present

## 2020-10-22 DIAGNOSIS — Z941 Heart transplant status: Secondary | ICD-10-CM | POA: Diagnosis not present

## 2020-10-25 DIAGNOSIS — Z944 Liver transplant status: Secondary | ICD-10-CM | POA: Diagnosis not present

## 2020-12-21 DIAGNOSIS — Z941 Heart transplant status: Secondary | ICD-10-CM | POA: Diagnosis not present

## 2020-12-21 DIAGNOSIS — E8582 Wild-type transthyretin-related (ATTR) amyloidosis: Secondary | ICD-10-CM | POA: Diagnosis not present

## 2020-12-21 DIAGNOSIS — D849 Immunodeficiency, unspecified: Secondary | ICD-10-CM | POA: Diagnosis not present

## 2020-12-21 DIAGNOSIS — Z944 Liver transplant status: Secondary | ICD-10-CM | POA: Diagnosis not present

## 2021-02-03 DIAGNOSIS — Z941 Heart transplant status: Secondary | ICD-10-CM | POA: Diagnosis not present

## 2021-02-03 DIAGNOSIS — Z7982 Long term (current) use of aspirin: Secondary | ICD-10-CM | POA: Diagnosis not present

## 2021-02-03 DIAGNOSIS — E785 Hyperlipidemia, unspecified: Secondary | ICD-10-CM | POA: Diagnosis not present

## 2021-02-03 DIAGNOSIS — Z944 Liver transplant status: Secondary | ICD-10-CM | POA: Diagnosis not present

## 2021-02-03 DIAGNOSIS — I10 Essential (primary) hypertension: Secondary | ICD-10-CM | POA: Diagnosis not present

## 2021-02-21 DIAGNOSIS — Z941 Heart transplant status: Secondary | ICD-10-CM | POA: Diagnosis not present

## 2021-02-21 DIAGNOSIS — Z7952 Long term (current) use of systemic steroids: Secondary | ICD-10-CM | POA: Diagnosis not present

## 2021-02-21 DIAGNOSIS — Z13820 Encounter for screening for osteoporosis: Secondary | ICD-10-CM | POA: Diagnosis not present

## 2021-04-11 ENCOUNTER — Ambulatory Visit: Payer: Medicare Other | Admitting: Internal Medicine

## 2021-06-07 LAB — PSA: PSA: 0.6

## 2021-06-15 ENCOUNTER — Other Ambulatory Visit: Payer: Self-pay

## 2021-06-15 ENCOUNTER — Ambulatory Visit (INDEPENDENT_AMBULATORY_CARE_PROVIDER_SITE_OTHER): Payer: Medicare Other | Admitting: Internal Medicine

## 2021-06-15 ENCOUNTER — Encounter: Payer: Self-pay | Admitting: Internal Medicine

## 2021-06-15 VITALS — BP 116/78 | HR 80 | Temp 98.2°F | Ht 69.0 in | Wt 184.0 lb

## 2021-06-15 DIAGNOSIS — E850 Non-neuropathic heredofamilial amyloidosis: Secondary | ICD-10-CM | POA: Diagnosis not present

## 2021-06-15 DIAGNOSIS — N183 Chronic kidney disease, stage 3 unspecified: Secondary | ICD-10-CM

## 2021-06-15 DIAGNOSIS — Z1211 Encounter for screening for malignant neoplasm of colon: Secondary | ICD-10-CM

## 2021-06-15 DIAGNOSIS — D510 Vitamin B12 deficiency anemia due to intrinsic factor deficiency: Secondary | ICD-10-CM | POA: Diagnosis not present

## 2021-06-15 DIAGNOSIS — Z0000 Encounter for general adult medical examination without abnormal findings: Secondary | ICD-10-CM

## 2021-06-15 DIAGNOSIS — Z0001 Encounter for general adult medical examination with abnormal findings: Secondary | ICD-10-CM

## 2021-06-15 DIAGNOSIS — E785 Hyperlipidemia, unspecified: Secondary | ICD-10-CM

## 2021-06-15 DIAGNOSIS — Z23 Encounter for immunization: Secondary | ICD-10-CM | POA: Diagnosis not present

## 2021-06-15 LAB — CBC WITH DIFFERENTIAL/PLATELET
Basophils Absolute: 0 10*3/uL (ref 0.0–0.1)
Basophils Relative: 0.7 % (ref 0.0–3.0)
Eosinophils Absolute: 0 10*3/uL (ref 0.0–0.7)
Eosinophils Relative: 0.8 % (ref 0.0–5.0)
HCT: 39.5 % (ref 39.0–52.0)
Hemoglobin: 13.1 g/dL (ref 13.0–17.0)
Lymphocytes Relative: 19.2 % (ref 12.0–46.0)
Lymphs Abs: 0.8 10*3/uL (ref 0.7–4.0)
MCHC: 33.2 g/dL (ref 30.0–36.0)
MCV: 89.3 fl (ref 78.0–100.0)
Monocytes Absolute: 0.4 10*3/uL (ref 0.1–1.0)
Monocytes Relative: 10.3 % (ref 3.0–12.0)
Neutro Abs: 2.9 10*3/uL (ref 1.4–7.7)
Neutrophils Relative %: 69 % (ref 43.0–77.0)
Platelets: 225 10*3/uL (ref 150.0–400.0)
RBC: 4.42 Mil/uL (ref 4.22–5.81)
RDW: 14.1 % (ref 11.5–15.5)
WBC: 4.2 10*3/uL (ref 4.0–10.5)

## 2021-06-15 LAB — FOLATE: Folate: 6.7 ng/mL (ref >5.9)

## 2021-06-15 LAB — BASIC METABOLIC PANEL
BUN: 24 mg/dL — ABNORMAL HIGH (ref 6–23)
CO2: 27 mEq/L (ref 19–32)
Calcium: 9.9 mg/dL (ref 8.4–10.5)
Chloride: 104 mEq/L (ref 96–112)
Creatinine, Ser: 1.74 mg/dL — ABNORMAL HIGH (ref 0.40–1.50)
GFR: 43.82 mL/min — ABNORMAL LOW (ref >60.00)
Glucose, Bld: 82 mg/dL (ref 70–99)
Potassium: 4.1 mEq/L (ref 3.5–5.1)
Sodium: 140 mEq/L (ref 135–145)

## 2021-06-15 LAB — VITAMIN B12: Vitamin B-12: 295 pg/mL (ref 211–911)

## 2021-06-15 LAB — LIPID PANEL
Cholesterol: 124 mg/dL (ref 0–200)
HDL: 52.6 mg/dL (ref >39.00)
LDL Cholesterol: 59 mg/dL (ref 0–99)
NonHDL: 71.81
Total CHOL/HDL Ratio: 2
Triglycerides: 63 mg/dL (ref 0.0–149.0)
VLDL: 12.6 mg/dL (ref 0.0–40.0)

## 2021-06-15 LAB — HEPATIC FUNCTION PANEL
ALT: 20 U/L (ref 0–53)
AST: 31 U/L (ref 0–37)
Albumin: 4.5 g/dL (ref 3.5–5.2)
Alkaline Phosphatase: 58 U/L (ref 39–117)
Bilirubin, Direct: 0.3 mg/dL (ref 0.0–0.3)
Total Bilirubin: 1.5 mg/dL — ABNORMAL HIGH (ref 0.2–1.2)
Total Protein: 7.6 g/dL (ref 6.0–8.3)

## 2021-06-15 NOTE — Patient Instructions (Signed)
Health Maintenance, Male Adopting a healthy lifestyle and getting preventive care are important in promoting health and wellness. Ask your health care provider about: The right schedule for you to have regular tests and exams. Things you can do on your own to prevent diseases and keep yourself healthy. What should I know about diet, weight, and exercise? Eat a healthy diet  Eat a diet that includes plenty of vegetables, fruits, low-fat dairy products, and lean protein. Do not eat a lot of foods that are high in solid fats, added sugars, or sodium.  Maintain a healthy weight Body mass index (BMI) is a measurement that can be used to identify possible weight problems. It estimates body fat based on height and weight. Your health care provider can help determine your BMI and help you achieve or maintain ahealthy weight. Get regular exercise Get regular exercise. This is one of the most important things you can do for your health. Most adults should: Exercise for at least 150 minutes each week. The exercise should increase your heart rate and make you sweat (moderate-intensity exercise). Do strengthening exercises at least twice a week. This is in addition to the moderate-intensity exercise. Spend less time sitting. Even light physical activity can be beneficial. Watch cholesterol and blood lipids Have your blood tested for lipids and cholesterol at 55 years of age, then havethis test every 5 years. You may need to have your cholesterol levels checked more often if: Your lipid or cholesterol levels are high. You are older than 55 years of age. You are at high risk for heart disease. What should I know about cancer screening? Many types of cancers can be detected early and may often be prevented. Depending on your health history and family history, you may need to have cancer screening at various ages. This may include screening for: Colorectal cancer. Prostate cancer. Skin cancer. Lung  cancer. What should I know about heart disease, diabetes, and high blood pressure? Blood pressure and heart disease High blood pressure causes heart disease and increases the risk of stroke. This is more likely to develop in people who have high blood pressure readings, are of African descent, or are overweight. Talk with your health care provider about your target blood pressure readings. Have your blood pressure checked: Every 3-5 years if you are 18-39 years of age. Every year if you are 40 years old or older. If you are between the ages of 65 and 75 and are a current or former smoker, ask your health care provider if you should have a one-time screening for abdominal aortic aneurysm (AAA). Diabetes Have regular diabetes screenings. This checks your fasting blood sugar level. Have the screening done: Once every three years after age 45 if you are at a normal weight and have a low risk for diabetes. More often and at a younger age if you are overweight or have a high risk for diabetes. What should I know about preventing infection? Hepatitis B If you have a higher risk for hepatitis B, you should be screened for this virus. Talk with your health care provider to find out if you are at risk forhepatitis B infection. Hepatitis C Blood testing is recommended for: Everyone born from 1945 through 1965. Anyone with known risk factors for hepatitis C. Sexually transmitted infections (STIs) You should be screened each year for STIs, including gonorrhea and chlamydia, if: You are sexually active and are younger than 55 years of age. You are older than 55 years of age   and your health care provider tells you that you are at risk for this type of infection. Your sexual activity has changed since you were last screened, and you are at increased risk for chlamydia or gonorrhea. Ask your health care provider if you are at risk. Ask your health care provider about whether you are at high risk for HIV.  Your health care provider may recommend a prescription medicine to help prevent HIV infection. If you choose to take medicine to prevent HIV, you should first get tested for HIV. You should then be tested every 3 months for as long as you are taking the medicine. Follow these instructions at home: Lifestyle Do not use any products that contain nicotine or tobacco, such as cigarettes, e-cigarettes, and chewing tobacco. If you need help quitting, ask your health care provider. Do not use street drugs. Do not share needles. Ask your health care provider for help if you need support or information about quitting drugs. Alcohol use Do not drink alcohol if your health care provider tells you not to drink. If you drink alcohol: Limit how much you have to 0-2 drinks a day. Be aware of how much alcohol is in your drink. In the [place] one drink equals one 12 oz bottle of beer (355 mL), one 5 oz glass of wine (148 mL), or one 1 oz glass of hard liquor (44 mL). General instructions Schedule regular health, dental, and eye exams. Stay current with your vaccines. Tell your health care provider if: You often feel depressed. You have ever been abused or do not feel safe at home. Summary Adopting a healthy lifestyle and getting preventive care are important in promoting health and wellness. Follow your health care provider's instructions about healthy diet, exercising, and getting tested or screened for diseases. Follow your health care provider's instructions on monitoring your cholesterol and blood pressure. This information is not intended to replace advice given to you by your health care provider. Make sure you discuss any questions you have with your healthcare provider. Document Revised: 10/22/2018 Document Reviewed: 10/22/2018 Elsevier Patient Education  2022 Elsevier Inc.  

## 2021-06-15 NOTE — Progress Notes (Signed)
Subjective:  Patient ID: Jason Ware, male    DOB: 1966/01/07  Age: 55 y.o. MRN: [id number]  CC: Annual Exam and Anemia  This visit occurred during the SARS-CoV-2 public health emergency.  Safety protocols were in place, including screening questions prior to the visit, additional usage of staff PPE, and extensive cleaning of exam room while observing appropriate contact time as indicated for disinfecting solutions.    HPI Jason Ware presents for a CPX and to establish.  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  He sees neurology for chronic headaches.  He is active and denies any recent episodes of chest pain, shortness of breath, diaphoresis, dizziness, lightheadedness, or edema.  He has a history of B12 deficiency.  History Jason Ware has a past medical history of Amyloidosis (HCC), Anxiety, CHF (congestive heart failure) (HCC), Depression, ICD (implantable cardioverter-defibrillator) in place, and Migraine.   He has a past surgical history that includes Heart transplant (2013); Liver transplant (2013); and Hernia repair.   His family history includes Healthy in his father; Heart disease in his mother.He reports that he has never smoked. He has never used smokeless tobacco. He reports that he does not drink alcohol and does not use drugs.  Outpatient Medications Prior to Visit  Medication Sig Dispense Refill   amitriptyline (ELAVIL) 25 MG tablet Take 1 tablet (25 mg total) by mouth at bedtime. 30 tablet 3   aspirin EC 81 MG tablet Take 81 mg by mouth every morning.     butalbital-acetaminophen-caffeine (FIORICET, ESGIC) 50-325-40 MG tablet Take 1 tablet by mouth every 6 (six) hours as needed for headache. Do not refill in less than 30 days 12 tablet 3   citalopram (CELEXA) 20 MG tablet Take 30 mg by mouth daily.      cycloSPORINE modified (NEORAL) 100 MG capsule Take 100 mg by mouth daily before breakfast.  3   cycloSPORINE modified  (NEORAL) 25 MG capsule Take 75 mg by mouth at bedtime.  3   diltiazem (DILACOR XR) 180 MG 24 hr capsule Take 180 mg by mouth daily.     magnesium oxide (MAG-OX) 400 MG tablet Take 800 mg by mouth daily.     mycophenolate (CELLCEPT) 500 MG tablet Take 1,000 mg by mouth 2 (two) times daily.     pantoprazole (PROTONIX) 40 MG tablet Take 40 mg by mouth daily.     promethazine (PHENERGAN) 25 MG tablet Take 1 tablet (25 mg total) by mouth every 8 (eight) hours as needed for nausea (or migrain headache. will cause sedation). 20 tablet 2   rizatriptan (MAXALT) 10 MG tablet Take 1 tablet (10 mg total) by mouth as needed for migraine. May repeat in 2 hours if needed 9 tablet 11   topiramate (TOPAMAX) 100 MG tablet Take 1 tablet (100 mg total) by mouth 2 (two) times daily. 60 tablet 6   cyanocobalamin ((VITAMIN B-12)) injection 1,000 mcg      No facility-administered medications prior to visit.    ROS Review of Systems  Constitutional:  Negative for diaphoresis and fatigue.  HENT: Negative.    Eyes:  Negative for visual disturbance.  Respiratory:  Negative for cough, chest tightness, shortness of breath and wheezing.   Cardiovascular:  Negative for chest pain, palpitations and leg swelling.  Gastrointestinal:  Negative for abdominal pain, diarrhea, nausea and vomiting.  Endocrine: Negative.   Genitourinary: Negative.  Negative for difficulty urinating, scrotal swelling and testicular pain.  Musculoskeletal: Negative.  Negative for arthralgias and myalgias.  Skin: Negative.   Neurological: Negative.  Negative for dizziness, weakness and headaches.  Hematological:  Negative for adenopathy. Does not bruise/bleed easily.  Psychiatric/Behavioral: Negative.     Objective:  BP 116/78 (BP Location: Left Arm, Patient Position: Sitting, Cuff Size: Large)   Pulse 80   Temp 98.2 F (36.8 C) (Oral)   Ht 5\' 9"  (1.753 m)   Wt 184 lb (83.5 kg)   SpO2 96%   BMI 27.17 kg/m   Physical Exam Vitals  reviewed. Exam conducted with a chaperone present.  Constitutional:      Appearance: Normal appearance.  HENT:     Nose: Nose normal.     Mouth/Throat:     Mouth: Mucous membranes are moist.  Eyes:     General: No scleral icterus.    Conjunctiva/sclera: Conjunctivae normal.  Cardiovascular:     Rate and Rhythm: Normal rate and regular rhythm.     Heart sounds: No murmur heard. Pulmonary:     Effort: Pulmonary effort is normal.     Breath sounds: No stridor. No wheezing, rhonchi or rales.  Abdominal:     General: Abdomen is flat.     Palpations: There is no mass.     Tenderness: There is no abdominal tenderness. There is no guarding.     Hernia: No hernia is present. There is no hernia in the left inguinal area or right inguinal area.  Genitourinary:    Pubic Area: No rash.      Penis: Normal and circumcised.      Testes: Normal.     Epididymis:     Right: Normal.     Left: Normal.     Prostate: Normal. Not enlarged, not tender and no nodules present.     Rectum: Normal. Guaiac result negative. No mass, tenderness, anal fissure, external hemorrhoid or internal hemorrhoid. Normal anal tone.  Musculoskeletal:        General: Normal range of motion.     Cervical back: Neck supple.     Right lower leg: No edema.     Left lower leg: No edema.  Lymphadenopathy:     Cervical: No cervical adenopathy.     Lower Body: No right inguinal adenopathy. No left inguinal adenopathy.  Skin:    General: Skin is warm and dry.  Neurological:     General: No focal deficit present.     Mental Status: He is alert.  Psychiatric:        Mood and Affect: Mood normal.        Behavior: Behavior normal.    Lab Results  Component Value Date   WBC 4.2 06/15/2021   HGB 13.1 06/15/2021   HCT 39.5 06/15/2021   PLT 225.0 06/15/2021   GLUCOSE 82 06/15/2021   CHOL 124 06/15/2021   TRIG 63.0 06/15/2021   HDL 52.60 06/15/2021   LDLCALC 59 06/15/2021   ALT 20 06/15/2021   AST 31 06/15/2021   NA  140 06/15/2021   K 4.1 06/15/2021   CL 104 06/15/2021   CREATININE 1.74 (H) 06/15/2021   BUN 24 (H) 06/15/2021   CO2 27 06/15/2021   TSH 2.150 04/10/2018   PSA 0.6 06/07/2021     Assessment & Plan:   Jason Ware was seen today for annual exam and anemia.  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 -  CBC with Differential/Platelet; Future -     Vitamin B12; Future -     Folate; Future -     Folate -     Vitamin B12 -     CBC with Differential/Platelet  Stage 3 chronic kidney disease, unspecified whether stage 3a or 3b CKD (HCC)- Will avoid nephrotoxic agents. -     Basic metabolic panel; Future -     Basic metabolic panel  Non-neuropathic heredofamilial amyloidosis (HCC)- No recent complications noted.  Encounter for general adult medical examination with abnormal findings- Exam completed, labs reviewed, vaccines reviewed and updated, cancer screenings addressed, patient education was given.  Hyperlipidemia with target LDL less than 160- Statin therapy is not indicated. -     Hepatic function panel; Future -     Lipid panel; Future -     Lipid panel -     Hepatic function panel  Screen for colon cancer -     Cologuard  Other orders -     Tdap vaccine greater than or equal to 7yo IM  I am having Berniece Salines. Folta maintain his citalopram, aspirin EC, magnesium oxide, mycophenolate, pantoprazole, diltiazem, rizatriptan, promethazine, amitriptyline, topiramate, butalbital-acetaminophen-caffeine, cycloSPORINE modified, and cycloSPORINE modified. We will stop administering cyanocobalamin.  No orders of the defined types were placed in this encounter.    Follow-up: Return in about 6 months (around 12/16/2021).  Sanda Linger, MD

## 2021-07-13 LAB — COLOGUARD: Cologuard: NEGATIVE

## 2022-02-06 DIAGNOSIS — I4510 Unspecified right bundle-branch block: Secondary | ICD-10-CM | POA: Diagnosis not present

## 2022-02-06 DIAGNOSIS — Z941 Heart transplant status: Secondary | ICD-10-CM | POA: Diagnosis not present

## 2022-02-06 DIAGNOSIS — Z9889 Other specified postprocedural states: Secondary | ICD-10-CM | POA: Diagnosis not present

## 2022-02-06 DIAGNOSIS — R0602 Shortness of breath: Secondary | ICD-10-CM | POA: Diagnosis not present

## 2022-02-06 DIAGNOSIS — G43009 Migraine without aura, not intractable, without status migrainosus: Secondary | ICD-10-CM | POA: Diagnosis not present

## 2022-02-06 DIAGNOSIS — E8582 Wild-type transthyretin-related (ATTR) amyloidosis: Secondary | ICD-10-CM | POA: Diagnosis not present

## 2022-02-06 DIAGNOSIS — I129 Hypertensive chronic kidney disease with stage 1 through stage 4 chronic kidney disease, or unspecified chronic kidney disease: Secondary | ICD-10-CM | POA: Diagnosis not present

## 2022-02-06 DIAGNOSIS — E859 Amyloidosis, unspecified: Secondary | ICD-10-CM | POA: Diagnosis not present

## 2022-02-06 DIAGNOSIS — I700 Atherosclerosis of aorta: Secondary | ICD-10-CM | POA: Diagnosis not present

## 2022-02-06 DIAGNOSIS — D849 Immunodeficiency, unspecified: Secondary | ICD-10-CM | POA: Diagnosis not present

## 2022-02-06 DIAGNOSIS — Z944 Liver transplant status: Secondary | ICD-10-CM | POA: Diagnosis not present

## 2022-02-06 DIAGNOSIS — E785 Hyperlipidemia, unspecified: Secondary | ICD-10-CM | POA: Diagnosis not present

## 2022-02-06 DIAGNOSIS — N183 Chronic kidney disease, stage 3 unspecified: Secondary | ICD-10-CM | POA: Diagnosis not present

## 2022-02-06 DIAGNOSIS — K432 Incisional hernia without obstruction or gangrene: Secondary | ICD-10-CM | POA: Diagnosis not present

## 2022-02-06 DIAGNOSIS — Z950 Presence of cardiac pacemaker: Secondary | ICD-10-CM | POA: Diagnosis not present

## 2022-04-18 DIAGNOSIS — Z941 Heart transplant status: Secondary | ICD-10-CM | POA: Diagnosis not present

## 2022-05-29 DIAGNOSIS — Z941 Heart transplant status: Secondary | ICD-10-CM | POA: Diagnosis not present

## 2022-06-06 DIAGNOSIS — Z941 Heart transplant status: Secondary | ICD-10-CM | POA: Diagnosis not present

## 2022-06-06 DIAGNOSIS — Z13220 Encounter for screening for lipoid disorders: Secondary | ICD-10-CM | POA: Diagnosis not present

## 2022-06-06 DIAGNOSIS — R809 Proteinuria, unspecified: Secondary | ICD-10-CM | POA: Diagnosis not present

## 2022-06-06 LAB — PSA: PSA: 0.7

## 2022-08-27 ENCOUNTER — Telehealth: Payer: Self-pay | Admitting: Internal Medicine

## 2022-08-27 NOTE — Telephone Encounter (Signed)
Patient is scheduled to come in and see Dr. Ronnald Ramp on 10/19 for Light-headedness, dizzy spells, lower extremities not functioning properly. We need a clinical staff person to speak with the patient to determine if he needs to be worked in sooner or if he needs to go to the ED to be evaluated. Call back number is [phone]

## 2022-08-27 NOTE — Telephone Encounter (Signed)
I have spoke to the pt. He stated that the Sx of lightheadedness, dizziness, and lower extremity issues happened 3 days ago and have resolved. I urged him to seek care at the ED if it happens again prior to his 10/19 appointment and he expressed understanding.

## 2022-08-30 ENCOUNTER — Ambulatory Visit (INDEPENDENT_AMBULATORY_CARE_PROVIDER_SITE_OTHER): Payer: Medicare Other | Admitting: Internal Medicine

## 2022-08-30 ENCOUNTER — Encounter: Payer: Self-pay | Admitting: Internal Medicine

## 2022-08-30 VITALS — BP 134/86 | HR 82 | Temp 98.2°F | Ht 69.0 in | Wt 186.0 lb

## 2022-08-30 DIAGNOSIS — R200 Anesthesia of skin: Secondary | ICD-10-CM | POA: Diagnosis not present

## 2022-08-30 DIAGNOSIS — G43109 Migraine with aura, not intractable, without status migrainosus: Secondary | ICD-10-CM | POA: Diagnosis not present

## 2022-08-30 DIAGNOSIS — R202 Paresthesia of skin: Secondary | ICD-10-CM

## 2022-08-30 DIAGNOSIS — N183 Chronic kidney disease, stage 3 unspecified: Secondary | ICD-10-CM

## 2022-08-30 MED ORDER — QULIPTA 60 MG PO TABS: 1.0000 | ORAL_TABLET | Freq: Every day | ORAL | 0 refills | Status: AC

## 2022-08-30 NOTE — Progress Notes (Unsigned)
Subjective:  Patient ID: Jason Ware, male    DOB: July 31, 1966  Age: 56 y.o. MRN: [id number]  CC: No chief complaint on file.   HPI BREANDAN PEOPLE presents for ***  Outpatient Medications Prior to Visit  Medication Sig Dispense Refill   amitriptyline (ELAVIL) 25 MG tablet Take 1 tablet (25 mg total) by mouth at bedtime. 30 tablet 3   aspirin EC 81 MG tablet Take 81 mg by mouth every morning.     butalbital-acetaminophen-caffeine (FIORICET, ESGIC) 50-325-40 MG tablet Take 1 tablet by mouth every 6 (six) hours as needed for headache. Do not refill in less than 30 days 12 tablet 3   citalopram (CELEXA) 20 MG tablet Take 30 mg by mouth daily.      cycloSPORINE modified (NEORAL) 100 MG capsule Take 100 mg by mouth daily before breakfast.  3   cycloSPORINE modified (NEORAL) 25 MG capsule Take 75 mg by mouth at bedtime.  3   diltiazem (DILACOR XR) 180 MG 24 hr capsule Take 180 mg by mouth daily.     magnesium oxide (MAG-OX) 400 MG tablet Take 800 mg by mouth daily.     mycophenolate (CELLCEPT) 500 MG tablet Take 1,000 mg by mouth 2 (two) times daily.     pantoprazole (PROTONIX) 40 MG tablet Take 40 mg by mouth daily.     promethazine (PHENERGAN) 25 MG tablet Take 1 tablet (25 mg total) by mouth every 8 (eight) hours as needed for nausea (or migrain headache. will cause sedation). 20 tablet 2   rizatriptan (MAXALT) 10 MG tablet Take 1 tablet (10 mg total) by mouth as needed for migraine. May repeat in 2 hours if needed 9 tablet 11   topiramate (TOPAMAX) 100 MG tablet Take 1 tablet (100 mg total) by mouth 2 (two) times daily. 60 tablet 6   No facility-administered medications prior to visit.    ROS Review of Systems  Objective:  BP 134/86 (BP Location: Left Arm, Patient Position: Sitting, Cuff Size: Large)   Pulse 82   Temp 98.2 F (36.8 C) (Oral)   Ht 5\' 9"  (1.753 m)   Wt 186 lb (84.4 kg)   SpO2 96%   BMI 27.47 kg/m   BP Readings from Last 3 Encounters:  08/30/22 134/86   06/15/21 116/78  04/14/18 128/84    Wt Readings from Last 3 Encounters:  08/30/22 186 lb (84.4 kg)  06/15/21 184 lb (83.5 kg)  04/14/18 177 lb (80.3 kg)    Physical Exam  Lab Results  Component Value Date   WBC 3.8 (L) 08/31/2022   HGB 13.0 08/31/2022   HCT 40.1 08/31/2022   PLT 233.0 08/31/2022   GLUCOSE 90 08/31/2022   CHOL 124 06/15/2021   TRIG 63.0 06/15/2021   HDL 52.60 06/15/2021   LDLCALC 59 06/15/2021   ALT 20 08/31/2022   AST 30 08/31/2022   NA 139 08/31/2022   K 4.1 08/31/2022   CL 104 08/31/2022   CREATININE 1.64 (H) 08/31/2022   BUN 27 (H) 08/31/2022   CO2 26 08/31/2022   TSH 2.03 08/31/2022   PSA 0.6 06/07/2021    CT HEAD WO CONTRAST  Result Date: 02/10/2018  [facility] [address] [place], [place] [phone] NEUROIMAGING REPORT STUDY DATE: 02/10/2018 PATIENT NAME: Malikye Reppond DOB: May 12, 1966 MRN: [id number] EXAM: CT of the head without contrast ORDERING CLINICIAN: [id number] MD, PhD CLINICAL HISTORY: 56 year old man with headaches COMPARISON FILMS: None TECHNIQUE: CT scan of  the head was obtained utilizing 5 mm axial slices from the skull base to the vertex. CONTRAST: None IMAGING SITE: [facility],  [address] FINDINGS: The fourth, third and lateral ventricles are normal in size and appearance.  No extra-axial fluid collections are seen.  No intracranial hemorrhage.  No evidence of mass effect or midline shift.  There is a small hypodense focus in the left cerebellar hemisphere that could represent a chronic lacunar infarction.  The brainstem and deep gray matter appears normal. The hemispheres appear normal. The orbits and their contents, paranasal sinuses and calvarium are unremarkable.   The CT scan of the head without contrast shows the following: 1.   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   No other abnormal foci are  noted.. 2.   There are no acute findings. INTERPRETING PHYSICIAN: Richard A. Sater, MD, PhD, FAAN Certified in  Neuroimaging by [employer]    Assessment & Plan:   Diagnoses and all orders for this visit:  Migraine with aura and without status migrainosus, not intractable -     Atogepant (QULIPTA) 60 MG TABS; Take 1 tablet by mouth daily.  Numbness and tingling -     Basic metabolic panel; Future -     Vitamin B12; Future -     CBC with Differential/Platelet; Future -     TSH; Future -     Vitamin B1; Future -     Hepatic function panel; Future -     Vitamin B6; Future -     Folate; Future -     Folate -     Vitamin B6 -     Hepatic function panel -     Vitamin B1 -     TSH -     CBC with Differential/Platelet -     Vitamin B12 -     Basic metabolic panel  Stage 3 chronic kidney disease, unspecified whether stage 3a or 3b CKD ([place])   I am having Salena Saner. Mcnicholas start on Qulipta. I am also having him maintain his citalopram, aspirin EC, magnesium oxide, mycophenolate, pantoprazole, diltiazem, rizatriptan, promethazine, amitriptyline, topiramate, butalbital-acetaminophen-caffeine, cycloSPORINE modified, and cycloSPORINE modified.  Meds ordered this encounter  Medications   Atogepant (QULIPTA) 60 MG TABS    Sig: Take 1 tablet by mouth daily.    Dispense:  48 tablet    Refill:  0     Follow-up: Return in about 3 months (around 11/30/2022).  Scarlette Calico, MD

## 2022-08-30 NOTE — Patient Instructions (Signed)
Migraine Headache A migraine headache is an intense, throbbing pain on one side or both sides of the head. Migraine headaches may also cause other symptoms, such as nausea, vomiting, and sensitivity to light and noise. A migraine headache can last from 4 hours to 3 days. Talk with your doctor about what things may bring on (trigger) your migraine headaches. What are the causes? The exact cause of this condition is not known. However, a migraine may be caused when nerves in the brain become irritated and release chemicals that cause inflammation of blood vessels. This inflammation causes pain. This condition may be triggered or caused by: Drinking alcohol. Smoking. Taking medicines, such as: Medicine used to treat chest pain (nitroglycerin). Birth control pills. Estrogen. Certain blood pressure medicines. Eating or drinking products that contain nitrates, glutamate, aspartame, or tyramine. Aged cheeses, chocolate, or caffeine may also be triggers. Doing physical activity. Other things that may trigger a migraine headache include: Menstruation. Pregnancy. Hunger. Stress. Lack of sleep or too much sleep. Weather changes. Fatigue. What increases the risk? The following factors may make you more likely to experience migraine headaches: Being a certain age. This condition is more common in people who are 25-55 years old. Being male. Having a family history of migraine headaches. Being Caucasian. Having a mental health condition, such as depression or anxiety. Being obese. What are the signs or symptoms? The main symptom of this condition is pulsating or throbbing pain. This pain may: Happen in any area of the head, such as on one side or both sides. Interfere with daily activities. Get worse with physical activity. Get worse with exposure to bright lights or loud noises. Other symptoms may include: Nausea. Vomiting. Dizziness. General sensitivity to bright lights, loud noises, or  smells. Before you get a migraine headache, you may get warning signs (an aura). An aura may include: Seeing flashing lights or having blind spots. Seeing bright spots, halos, or zigzag lines. Having tunnel vision or blurred vision. Having numbness or a tingling feeling. Having trouble talking. Having muscle weakness. Some people have symptoms after a migraine headache (postdromal phase), such as: Feeling tired. Difficulty concentrating. How is this diagnosed? A migraine headache can be diagnosed based on: Your symptoms. A physical exam. Tests, such as: CT scan or an MRI of the head. These imaging tests can help rule out other causes of headaches. Taking fluid from the spine (lumbar puncture) and analyzing it (cerebrospinal fluid analysis, or CSF analysis). How is this treated? This condition may be treated with medicines that: Relieve pain. Relieve nausea. Prevent migraine headaches. Treatment for this condition may also include: Acupuncture. Lifestyle changes like avoiding foods that trigger migraine headaches. Biofeedback. Cognitive behavioral therapy. Follow these instructions at home: Medicines Take over-the-counter and prescription medicines only as told by your health care provider. Ask your health care provider if the medicine prescribed to you: Requires you to avoid driving or using heavy machinery. Can cause constipation. You may need to take these actions to prevent or treat constipation: Drink enough fluid to keep your urine pale yellow. Take over-the-counter or prescription medicines. Eat foods that are high in fiber, such as beans, whole grains, and fresh fruits and vegetables. Limit foods that are high in fat and processed sugars, such as fried or sweet foods. Lifestyle Do not drink alcohol. Do not use any products that contain nicotine or tobacco, such as cigarettes, e-cigarettes, and chewing tobacco. If you need help quitting, ask your health care  provider. Get at least 8   hours of sleep every night. Find ways to manage stress, such as meditation, deep breathing, or yoga. General instructions     Keep a journal to find out what may trigger your migraine headaches. For example, write down: What you eat and drink. How much sleep you get. Any change to your diet or medicines. If you have a migraine headache: Avoid things that make your symptoms worse, such as bright lights. It may help to lie down in a dark, quiet room. Do not drive or use heavy machinery. Ask your health care provider what activities are safe for you while you are experiencing symptoms. Keep all follow-up visits as told by your health care provider. This is important. Contact a health care provider if: You develop symptoms that are different or more severe than your usual migraine headache symptoms. You have more than 15 headache days in one month. Get help right away if: Your migraine headache becomes severe. Your migraine headache lasts longer than 72 hours. You have a fever. You have a stiff neck. You have vision loss. Your muscles feel weak or like you cannot control them. You start to lose your balance often. You have trouble walking. You faint. You have a seizure. Summary A migraine headache is an intense, throbbing pain on one side or both sides of the head. Migraines may also cause other symptoms, such as nausea, vomiting, and sensitivity to light and noise. This condition may be treated with medicines and lifestyle changes. You may also need to avoid certain things that trigger a migraine headache. Keep a journal to find out what may trigger your migraine headaches. Contact your health care provider if you have more than 15 headache days in a month or you develop symptoms that are different or more severe than your usual migraine headache symptoms. This information is not intended to replace advice given to you by your health care provider. Make sure  you discuss any questions you have with your health care provider. Document Revised: 02/20/2019 Document Reviewed: 12/11/2018 Elsevier Patient Education  2023 Elsevier Inc.  

## 2022-08-31 DIAGNOSIS — R200 Anesthesia of skin: Secondary | ICD-10-CM | POA: Diagnosis not present

## 2022-08-31 DIAGNOSIS — R202 Paresthesia of skin: Secondary | ICD-10-CM | POA: Diagnosis not present

## 2022-08-31 LAB — HEPATIC FUNCTION PANEL
ALT: 20 U/L (ref 0–53)
AST: 30 U/L (ref 0–37)
Albumin: 4.6 g/dL (ref 3.5–5.2)
Alkaline Phosphatase: 71 U/L (ref 39–117)
Bilirubin, Direct: 0.2 mg/dL (ref 0.0–0.3)
Total Bilirubin: 1 mg/dL (ref 0.2–1.2)
Total Protein: 7.3 g/dL (ref 6.0–8.3)

## 2022-08-31 LAB — CBC WITH DIFFERENTIAL/PLATELET
Basophils Absolute: 0 10*3/uL (ref 0.0–0.1)
Basophils Relative: 0.6 % (ref 0.0–3.0)
Eosinophils Absolute: 0.1 10*3/uL (ref 0.0–0.7)
Eosinophils Relative: 1.7 % (ref 0.0–5.0)
HCT: 40.1 % (ref 39.0–52.0)
Hemoglobin: 13 g/dL (ref 13.0–17.0)
Lymphocytes Relative: 31.2 % (ref 12.0–46.0)
Lymphs Abs: 1.2 10*3/uL (ref 0.7–4.0)
MCHC: 32.5 g/dL (ref 30.0–36.0)
MCV: 90.2 fl (ref 78.0–100.0)
Monocytes Absolute: 0.5 10*3/uL (ref 0.1–1.0)
Monocytes Relative: 12.7 % — ABNORMAL HIGH (ref 3.0–12.0)
Neutro Abs: 2.1 10*3/uL (ref 1.4–7.7)
Neutrophils Relative %: 53.8 % (ref 43.0–77.0)
Platelets: 233 10*3/uL (ref 150.0–400.0)
RBC: 4.45 Mil/uL (ref 4.22–5.81)
RDW: 14.2 % (ref 11.5–15.5)
WBC: 3.8 10*3/uL — ABNORMAL LOW (ref 4.0–10.5)

## 2022-08-31 LAB — BASIC METABOLIC PANEL
BUN: 27 mg/dL — ABNORMAL HIGH (ref 6–23)
CO2: 26 mEq/L (ref 19–32)
Calcium: 9.7 mg/dL (ref 8.4–10.5)
Chloride: 104 mEq/L (ref 96–112)
Creatinine, Ser: 1.64 mg/dL — ABNORMAL HIGH (ref 0.40–1.50)
GFR: 46.65 mL/min — ABNORMAL LOW (ref >60.00)
Glucose, Bld: 90 mg/dL (ref 70–99)
Potassium: 4.1 mEq/L (ref 3.5–5.1)
Sodium: 139 mEq/L (ref 135–145)

## 2022-08-31 LAB — VITAMIN B12: Vitamin B-12: 237 pg/mL (ref 211–911)

## 2022-08-31 LAB — TSH: TSH: 2.03 u[IU]/mL (ref 0.35–5.50)

## 2022-08-31 LAB — FOLATE: Folate: 7.7 ng/mL (ref >5.9)

## 2022-09-05 LAB — VITAMIN B1: Vitamin B1 (Thiamine): 9 nmol/L (ref 8–30)

## 2022-09-05 LAB — VITAMIN B6: Vitamin B6: 15.5 ng/mL (ref 2.1–21.7)

## 2022-12-03 DIAGNOSIS — Z941 Heart transplant status: Secondary | ICD-10-CM | POA: Diagnosis not present

## 2022-12-03 DIAGNOSIS — I4510 Unspecified right bundle-branch block: Secondary | ICD-10-CM | POA: Diagnosis not present

## 2022-12-03 DIAGNOSIS — I2729 Other secondary pulmonary hypertension: Secondary | ICD-10-CM | POA: Diagnosis not present

## 2022-12-03 DIAGNOSIS — Z4821 Encounter for aftercare following heart transplant: Secondary | ICD-10-CM | POA: Diagnosis not present

## 2022-12-03 DIAGNOSIS — E785 Hyperlipidemia, unspecified: Secondary | ICD-10-CM | POA: Diagnosis not present

## 2022-12-03 LAB — LIPID PANEL
Cholesterol: 152 (ref 0–200)
HDL: 49 (ref 35–70)
LDL Cholesterol: 90
Triglycerides: 63 (ref 40–160)

## 2022-12-18 DIAGNOSIS — Z23 Encounter for immunization: Secondary | ICD-10-CM | POA: Diagnosis not present

## 2022-12-18 DIAGNOSIS — Z941 Heart transplant status: Secondary | ICD-10-CM | POA: Diagnosis not present

## 2023-01-04 DIAGNOSIS — Z941 Heart transplant status: Secondary | ICD-10-CM | POA: Diagnosis not present

## 2023-01-04 DIAGNOSIS — R221 Localized swelling, mass and lump, neck: Secondary | ICD-10-CM | POA: Diagnosis not present

## 2023-01-04 DIAGNOSIS — G9389 Other specified disorders of brain: Secondary | ICD-10-CM | POA: Diagnosis not present

## 2023-01-04 LAB — BASIC METABOLIC PANEL
BUN: 22 — AB (ref 4–21)
CO2: 28 — AB (ref 13–22)
Chloride: 105 (ref 99–108)
Creatinine: 1.6 — AB (ref 0.6–1.3)
Glucose: 91
Potassium: 4.3 mEq/L (ref 3.5–5.1)
Sodium: 139 (ref 137–147)

## 2023-01-04 LAB — COMPREHENSIVE METABOLIC PANEL: Calcium: 9.6 (ref 8.7–10.7)

## 2023-01-04 LAB — CBC AND DIFFERENTIAL
HCT: 40 — AB (ref 41–53)
Hemoglobin: 13.5 (ref 13.5–17.5)
Platelets: 254 10*3/uL (ref 150–400)
WBC: 4.3

## 2023-01-16 ENCOUNTER — Ambulatory Visit (INDEPENDENT_AMBULATORY_CARE_PROVIDER_SITE_OTHER): Payer: Medicare Other | Admitting: Internal Medicine

## 2023-01-16 ENCOUNTER — Encounter: Payer: Self-pay | Admitting: Internal Medicine

## 2023-01-16 VITALS — BP 134/88 | HR 88 | Temp 98.6°F | Resp 16 | Ht 69.0 in | Wt 188.8 lb

## 2023-01-16 DIAGNOSIS — B370 Candidal stomatitis: Secondary | ICD-10-CM | POA: Insufficient documentation

## 2023-01-16 DIAGNOSIS — N183 Chronic kidney disease, stage 3 unspecified: Secondary | ICD-10-CM

## 2023-01-16 DIAGNOSIS — I151 Hypertension secondary to other renal disorders: Secondary | ICD-10-CM

## 2023-01-16 DIAGNOSIS — K148 Other diseases of tongue: Secondary | ICD-10-CM

## 2023-01-16 MED ORDER — CLOTRIMAZOLE 10 MG MT TROC: 10.0000 mg | Freq: Every day | OROMUCOSAL | 1 refills | Status: AC

## 2023-01-16 NOTE — Patient Instructions (Signed)

## 2023-01-16 NOTE — Progress Notes (Unsigned)
Subjective:  Patient ID: Jason Ware, male    DOB: October 21, 1966  Age: 57 y.o. MRN: [id number]  CC: Hypertension   HPI Jason Ware presents for f/up ---  He complains of a 4-day history of white coating on his tongue and lesions on the sides of the tongue.  The symptoms are slowly improving.  Outpatient Medications Prior to Visit  Medication Sig Dispense Refill   amitriptyline (ELAVIL) 25 MG tablet Take 1 tablet (25 mg total) by mouth at bedtime. 30 tablet 3   aspirin EC 81 MG tablet Take 81 mg by mouth every morning.     Atogepant (QULIPTA) 60 MG TABS Take 1 tablet by mouth daily. 48 tablet 0   atorvastatin (LIPITOR) 20 MG tablet Take 20 mg by mouth daily.     citalopram (CELEXA) 20 MG tablet Take 30 mg by mouth daily.      cycloSPORINE modified (NEORAL) 100 MG capsule Take 100 mg by mouth daily before breakfast.  3   cycloSPORINE modified (NEORAL) 25 MG capsule Take 75 mg by mouth at bedtime.  3   diltiazem (DILACOR XR) 180 MG 24 hr capsule Take 180 mg by mouth daily.     magnesium oxide (MAG-OX) 400 MG tablet Take 800 mg by mouth daily.     mycophenolate (CELLCEPT) 500 MG tablet Take 1,000 mg by mouth 2 (two) times daily.     pantoprazole (PROTONIX) 40 MG tablet Take 40 mg by mouth daily.     butalbital-acetaminophen-caffeine (FIORICET, ESGIC) 50-325-40 MG tablet Take 1 tablet by mouth every 6 (six) hours as needed for headache. Do not refill in less than 30 days 12 tablet 3   rizatriptan (MAXALT) 10 MG tablet Take 1 tablet (10 mg total) by mouth as needed for migraine. May repeat in 2 hours if needed 9 tablet 11   topiramate (TOPAMAX) 100 MG tablet Take 1 tablet (100 mg total) by mouth 2 (two) times daily. 60 tablet 6   promethazine (PHENERGAN) 25 MG tablet Take 1 tablet (25 mg total) by mouth every 8 (eight) hours as needed for nausea (or migrain headache. will cause sedation). (Patient not taking: Reported on 01/16/2023) 20 tablet 2   No facility-administered medications prior to  visit.    ROS Review of Systems  Constitutional: Negative.  Negative for diaphoresis and fatigue.  HENT:  Positive for mouth sores. Negative for sore throat and trouble swallowing.   Eyes: Negative.   Respiratory:  Negative for cough, chest tightness and wheezing.   Cardiovascular:  Negative for chest pain, palpitations and leg swelling.  Gastrointestinal:  Negative for abdominal pain, nausea and vomiting.  Endocrine: Negative.   Genitourinary: Negative.  Negative for difficulty urinating.  Musculoskeletal: Negative.   Skin: Negative.   Neurological: Negative.  Negative for dizziness and weakness.  Hematological:  Negative for adenopathy. Does not bruise/bleed easily.  Psychiatric/Behavioral: Negative.      Objective:  BP 134/88 (BP Location: Left Arm, Patient Position: Sitting, Cuff Size: Normal)   Pulse 88   Temp 98.6 F (37 C) (Oral)   Resp 16   Ht '5\' 9"'$  (1.753 m)   Wt 188 lb 12.8 oz (85.6 kg)   SpO2 96%   BMI 27.88 kg/m   BP Readings from Last 3 Encounters:  01/16/23 134/88  08/30/22 134/86  06/15/21 116/78    Wt Readings from Last 3 Encounters:  01/16/23 188 lb 12.8 oz (85.6 kg)  08/30/22 186 lb (84.4 kg)  06/15/21 184 lb (83.5  kg)    Physical Exam Vitals reviewed.  HENT:     Head:     Comments: There is a white coating over his tongue.  There are raised, fleshy lesions on the sides of the tongue.    Mouth/Throat:     Mouth: Mucous membranes are moist.  Eyes:     General: No scleral icterus.    Conjunctiva/sclera: Conjunctivae normal.  Cardiovascular:     Rate and Rhythm: Normal rate and regular rhythm.     Heart sounds: No murmur heard. Pulmonary:     Effort: Pulmonary effort is normal.     Breath sounds: No stridor. No wheezing, rhonchi or rales.  Abdominal:     General: Abdomen is flat.     Palpations: There is no mass.     Tenderness: There is no abdominal tenderness. There is no guarding.     Hernia: No hernia is present.  Musculoskeletal:         General: Normal range of motion.     Cervical back: Neck supple.     Right lower leg: No edema.     Left lower leg: No edema.  Lymphadenopathy:     Cervical: No cervical adenopathy.  Skin:    General: Skin is warm and dry.     Coloration: Skin is not pale.  Neurological:     General: No focal deficit present.     Mental Status: He is alert. Mental status is at baseline.  Psychiatric:        Mood and Affect: Mood normal.        Behavior: Behavior normal.     Lab Results  Component Value Date   WBC 4.3 01/04/2023   HGB 13.5 01/04/2023   HCT 40 (A) 01/04/2023   PLT 254 01/04/2023   GLUCOSE 90 08/31/2022   CHOL 124 06/15/2021   TRIG 63.0 06/15/2021   HDL 52.60 06/15/2021   LDLCALC 59 06/15/2021   ALT 20 08/31/2022   AST 30 08/31/2022   NA 139 01/04/2023   K 4.3 01/04/2023   CL 105 01/04/2023   CREATININE 1.6 (A) 01/04/2023   BUN 22 (A) 01/04/2023   CO2 28 (A) 01/04/2023   TSH 2.03 08/31/2022   PSA 0.6 06/07/2021    CT HEAD WO CONTRAST  Result Date: 02/10/2018  [facility] [address] [place], [place] [address] [phone] NEUROIMAGING REPORT STUDY DATE: 02/10/2018 PATIENT NAME: Jason Ware DOB: 1966-08-07 MRN: [id number] EXAM: CT of the head without contrast ORDERING CLINICIAN: Marcial Pacas MD, PhD CLINICAL HISTORY: 57 year old man with headaches COMPARISON FILMS: None TECHNIQUE: CT scan of the head was obtained utilizing 5 mm axial slices from the skull base to the vertex. CONTRAST: None IMAGING SITE: [facility],  [address] FINDINGS: The fourth, third and lateral ventricles are normal in size and appearance.  No extra-axial fluid collections are seen.  No intracranial hemorrhage.  No evidence of mass effect or midline shift.  There is a small hypodense focus in the left cerebellar hemisphere that could represent a chronic lacunar infarction.  The brainstem and deep gray matter appears normal. The hemispheres appear normal. The orbits  and their contents, paranasal sinuses and calvarium are unremarkable.   The CT scan of the head without contrast shows the following: 1.   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   No other abnormal foci are noted.. 2.   There  are no acute findings. INTERPRETING PHYSICIAN: Richard A. Felecia Shelling, MD, PhD, FAAN Certified in  Neuroimaging by [employer] [facility] [place]:   Jason Ware was seen today for hypertension.  Diagnoses and all orders for this visit:  Lesion of tongue -     Ambulatory referral to Oral Maxillofacial Surgery  Oral pharyngeal candidiasis -     clotrimazole (MYCELEX) 10 MG troche; Take 1 tablet (10 mg total) by mouth 5 (five) times daily for 10 days.  Stage 3 chronic kidney disease, unspecified whether stage 3a or 3b CKD ([place])- His renal function is stable.  Hypertension secondary to other renal disorders- His blood pressure is adequately well-controlled.   I have discontinued Christifer Etherton. Sustaita's rizatriptan, promethazine, topiramate, and butalbital-acetaminophen-caffeine. I am also having him start on clotrimazole. Additionally, I am having him maintain his citalopram, aspirin EC, magnesium oxide, mycophenolate, pantoprazole, diltiazem, amitriptyline, cycloSPORINE modified, cycloSPORINE modified, Qulipta, and atorvastatin.  Meds ordered this encounter  Medications   clotrimazole (MYCELEX) 10 MG troche    Sig: Take 1 tablet (10 mg total) by mouth 5 (five) times daily for 10 days.    Dispense:  50 tablet    Refill:  1     Follow-up: No follow-ups on file.  Scarlette Calico, MD

## 2023-01-17 DIAGNOSIS — I151 Hypertension secondary to other renal disorders: Secondary | ICD-10-CM | POA: Insufficient documentation

## 2023-03-26 DIAGNOSIS — Z944 Liver transplant status: Secondary | ICD-10-CM | POA: Diagnosis not present

## 2023-03-26 DIAGNOSIS — Z941 Heart transplant status: Secondary | ICD-10-CM | POA: Diagnosis not present

## 2023-03-26 LAB — HEPATIC FUNCTION PANEL
ALT: 23 U/L (ref 10–40)
AST: 31 (ref 14–40)
Alkaline Phosphatase: 81 (ref 25–125)
Bilirubin, Total: 7.4

## 2023-03-26 LAB — COMPREHENSIVE METABOLIC PANEL
Albumin: 4.5 (ref 3.5–5.0)
Calcium: 9.8 (ref 8.7–10.7)
eGFR: 53

## 2023-03-26 LAB — BASIC METABOLIC PANEL
BUN: 27 — AB (ref 4–21)
CO2: 27 — AB (ref 13–22)
Chloride: 105 (ref 99–108)
Creatinine: 1.5 — AB (ref 0.6–1.3)
Glucose: 83
Potassium: 4.3 mEq/L (ref 3.5–5.1)
Sodium: 140 (ref 137–147)

## 2023-03-26 LAB — CBC AND DIFFERENTIAL
HCT: 38 — AB (ref 41–53)
Hemoglobin: 13.2 — AB (ref 13.5–17.5)
Platelets: 259 10*3/uL (ref 150–400)
WBC: 4.2

## 2023-04-29 DIAGNOSIS — K432 Incisional hernia without obstruction or gangrene: Secondary | ICD-10-CM | POA: Diagnosis not present

## 2023-04-29 DIAGNOSIS — Z949 Transplanted organ and tissue status, unspecified: Secondary | ICD-10-CM | POA: Diagnosis not present

## 2023-06-06 ENCOUNTER — Ambulatory Visit (INDEPENDENT_AMBULATORY_CARE_PROVIDER_SITE_OTHER): Payer: Medicare Other | Admitting: Internal Medicine

## 2023-06-06 VITALS — BP 126/82 | HR 94 | Temp 98.4°F | Ht 69.0 in | Wt 177.0 lb

## 2023-06-06 DIAGNOSIS — E785 Hyperlipidemia, unspecified: Secondary | ICD-10-CM

## 2023-06-06 DIAGNOSIS — Z113 Encounter for screening for infections with a predominantly sexual mode of transmission: Secondary | ICD-10-CM

## 2023-06-06 DIAGNOSIS — Z0000 Encounter for general adult medical examination without abnormal findings: Secondary | ICD-10-CM | POA: Diagnosis not present

## 2023-06-06 DIAGNOSIS — Z0001 Encounter for general adult medical examination with abnormal findings: Secondary | ICD-10-CM

## 2023-06-06 DIAGNOSIS — D510 Vitamin B12 deficiency anemia due to intrinsic factor deficiency: Secondary | ICD-10-CM | POA: Diagnosis not present

## 2023-06-06 NOTE — Patient Instructions (Signed)
Health Maintenance, Male Adopting a healthy lifestyle and getting preventive care are important in promoting health and wellness. Ask your health care provider about: The right schedule for you to have regular tests and exams. Things you can do on your own to prevent diseases and keep yourself healthy. What should I know about diet, weight, and exercise? Eat a healthy diet  Eat a diet that includes plenty of vegetables, fruits, low-fat dairy products, and lean protein. Do not eat a lot of foods that are high in solid fats, added sugars, or sodium. Maintain a healthy weight Body mass index (BMI) is a measurement that can be used to identify possible weight problems. It estimates body fat based on height and weight. Your health care provider can help determine your BMI and help you achieve or maintain a healthy weight. Get regular exercise Get regular exercise. This is one of the most important things you can do for your health. Most adults should: Exercise for at least 150 minutes each week. The exercise should increase your heart rate and make you sweat (moderate-intensity exercise). Do strengthening exercises at least twice a week. This is in addition to the moderate-intensity exercise. Spend less time sitting. Even light physical activity can be beneficial. Watch cholesterol and blood lipids Have your blood tested for lipids and cholesterol at 57 years of age, then have this test every 5 years. You may need to have your cholesterol levels checked more often if: Your lipid or cholesterol levels are high. You are older than 57 years of age. You are at high risk for heart disease. What should I know about cancer screening? Many types of cancers can be detected early and may often be prevented. Depending on your health history and family history, you may need to have cancer screening at various ages. This may include screening for: Colorectal cancer. Prostate cancer. Skin cancer. Lung  cancer. What should I know about heart disease, diabetes, and high blood pressure? Blood pressure and heart disease High blood pressure causes heart disease and increases the risk of stroke. This is more likely to develop in people who have high blood pressure readings or are overweight. Talk with your health care provider about your target blood pressure readings. Have your blood pressure checked: Every 3-5 years if you are 18-39 years of age. Every year if you are 40 years old or older. If you are between the ages of 65 and 75 and are a current or former smoker, ask your health care provider if you should have a one-time screening for abdominal aortic aneurysm (AAA). Diabetes Have regular diabetes screenings. This checks your fasting blood sugar level. Have the screening done: Once every three years after age 45 if you are at a normal weight and have a low risk for diabetes. More often and at a younger age if you are overweight or have a high risk for diabetes. What should I know about preventing infection? Hepatitis B If you have a higher risk for hepatitis B, you should be screened for this virus. Talk with your health care provider to find out if you are at risk for hepatitis B infection. Hepatitis C Blood testing is recommended for: Everyone born from 1945 through 1965. Anyone with known risk factors for hepatitis C. Sexually transmitted infections (STIs) You should be screened each year for STIs, including gonorrhea and chlamydia, if: You are sexually active and are younger than 57 years of age. You are older than 57 years of age and your   health care provider tells you that you are at risk for this type of infection. Your sexual activity has changed since you were last screened, and you are at increased risk for chlamydia or gonorrhea. Ask your health care provider if you are at risk. Ask your health care provider about whether you are at high risk for HIV. Your health care provider  may recommend a prescription medicine to help prevent HIV infection. If you choose to take medicine to prevent HIV, you should first get tested for HIV. You should then be tested every 3 months for as long as you are taking the medicine. Follow these instructions at home: Alcohol use Do not drink alcohol if your health care provider tells you not to drink. If you drink alcohol: Limit how much you have to 0-2 drinks a day. Know how much alcohol is in your drink. In the [place] one drink equals one 12 oz bottle of beer (355 mL), one 5 oz glass of wine (148 mL), or one 1 oz glass of hard liquor (44 mL). Lifestyle Do not use any products that contain nicotine or tobacco. These products include cigarettes, chewing tobacco, and vaping devices, such as e-cigarettes. If you need help quitting, ask your health care provider. Do not use street drugs. Do not share needles. Ask your health care provider for help if you need support or information about quitting drugs. General instructions Schedule regular health, dental, and eye exams. Stay current with your vaccines. Tell your health care provider if: You often feel depressed. You have ever been abused or do not feel safe at home. Summary Adopting a healthy lifestyle and getting preventive care are important in promoting health and wellness. Follow your health care provider's instructions about healthy diet, exercising, and getting tested or screened for diseases. Follow your health care provider's instructions on monitoring your cholesterol and blood pressure. This information is not intended to replace advice given to you by your health care provider. Make sure you discuss any questions you have with your health care provider. Document Revised: 03/20/2021 Document Reviewed: 03/20/2021 Elsevier Patient Education  2024 [employer] Inc.  

## 2023-06-06 NOTE — Progress Notes (Unsigned)
Subjective:  Patient ID: Jason Ware, male    DOB: [birth year]  Age: 57 y.o. MRN: [id number]  CC: Annual Exam   HPI SHAWN CARATTINI presents for a CPX and f/up ----  Discussed the use of AI scribe software for clinical note transcription with the patient, who gave verbal consent to proceed.  History of Present Illness   The patient, with a history of heart and liver transplants, presents for a routine check-up and sexually transmitted infection (STI) screening. He is currently in a new relationship and wishes to ensure both parties are healthy. He denies any symptoms of STIs, including rashes, sores, lesions, painful urination, sore throat, or swollen lymph nodes. The patient's last STI was gonorrhea, contracted at the age of 53.  The patient has been regularly following up with his transplant team in [place], with the last visit in May. He reports that his numbers were satisfactory at that time. He is unsure if any prostate blood tests were conducted during these visits, but he does confirm regular blood work.   The patient is on Lipitor for cholesterol management, but he is unsure when his last cholesterol level check was conducted.       Outpatient Medications Prior to Visit  Medication Sig Dispense Refill   amitriptyline (ELAVIL) 25 MG tablet Take 1 tablet (25 mg total) by mouth at bedtime. 30 tablet 3   aspirin EC 81 MG tablet Take 81 mg by mouth every morning.     Atogepant (QULIPTA) 60 MG TABS Take 1 tablet by mouth daily. 48 tablet 0   citalopram (CELEXA) 20 MG tablet Take 30 mg by mouth daily.      cycloSPORINE modified (NEORAL) 100 MG capsule Take 100 mg by mouth daily before breakfast.  3   cycloSPORINE modified (NEORAL) 25 MG capsule Take 75 mg by mouth at bedtime.  3   diltiazem (DILACOR XR) 180 MG 24 hr capsule Take 180 mg by mouth daily.     magnesium oxide (MAG-OX) 400 MG tablet Take 800 mg by mouth daily.     mycophenolate (CELLCEPT) 500 MG tablet Take 1,000 mg by  mouth 2 (two) times daily.     pantoprazole (PROTONIX) 40 MG tablet Take 40 mg by mouth daily.     atorvastatin (LIPITOR) 20 MG tablet Take 20 mg by mouth daily.     No facility-administered medications prior to visit.    ROS Review of Systems  Constitutional: Negative.  Negative for appetite change, diaphoresis, fatigue and unexpected weight change.  HENT: Negative.  Negative for sore throat and trouble swallowing.   Eyes: Negative.   Respiratory:  Negative for cough, chest tightness and wheezing.   Cardiovascular:  Negative for chest pain, palpitations and leg swelling.  Gastrointestinal:  Negative for abdominal pain, constipation, diarrhea, nausea and vomiting.  Endocrine: Negative.   Genitourinary: Negative.  Negative for difficulty urinating.  Musculoskeletal: Negative.  Negative for arthralgias and myalgias.  Skin: Negative.  Negative for color change and rash.  Neurological:  Negative for dizziness and weakness.  Hematological:  Negative for adenopathy. Does not bruise/bleed easily.  Psychiatric/Behavioral: Negative.      Objective:  BP 126/82 (BP Location: Left Arm, Patient Position: Sitting, Cuff Size: Large)   Pulse 94   Temp 98.4 F (36.9 C) (Oral)   Ht 5\' 9"  (1.753 m)   Wt 177 lb (80.3 kg)   SpO2 97%   BMI 26.14 kg/m   BP Readings from Last 3 Encounters:  06/06/23  126/82  01/16/23 134/88  08/30/22 134/86    Wt Readings from Last 3 Encounters:  06/06/23 177 lb (80.3 kg)  01/16/23 188 lb 12.8 oz (85.6 kg)  08/30/22 186 lb (84.4 kg)    Physical Exam Vitals reviewed.  Constitutional:      Appearance: Normal appearance.  HENT:     Mouth/Throat:     Mouth: Mucous membranes are moist.  Eyes:     General: No scleral icterus.    Conjunctiva/sclera: Conjunctivae normal.  Cardiovascular:     Rate and Rhythm: Normal rate and regular rhythm.     Heart sounds: No murmur heard.    No friction rub. No gallop.  Pulmonary:     Effort: Pulmonary effort is  normal.     Breath sounds: No stridor. No wheezing, rhonchi or rales.  Abdominal:     General: Abdomen is flat.     Palpations: There is no mass.     Tenderness: There is no abdominal tenderness. There is no guarding.     Hernia: No hernia is present.  Musculoskeletal:        General: Normal range of motion.     Cervical back: Neck supple.     Right lower leg: No edema.     Left lower leg: No edema.  Lymphadenopathy:     Cervical: No cervical adenopathy.  Skin:    General: Skin is warm and dry.  Neurological:     General: No focal deficit present.     Mental Status: He is alert. Mental status is at baseline.  Psychiatric:        Mood and Affect: Mood normal.        Behavior: Behavior normal.     Lab Results  Component Value Date   WBC 4.2 03/26/2023   HGB 13.2 (A) 03/26/2023   HCT 38 (A) 03/26/2023   PLT 259 03/26/2023   GLUCOSE 90 08/31/2022   CHOL 152 12/03/2022   TRIG 63 12/03/2022   HDL 49 12/03/2022   LDLCALC 90 12/03/2022   ALT 23 03/26/2023   AST 31 03/26/2023   NA 140 03/26/2023   K 4.3 03/26/2023   CL 105 03/26/2023   CREATININE 1.5 (A) 03/26/2023   BUN 27 (A) 03/26/2023   CO2 27 (A) 03/26/2023   TSH 2.03 08/31/2022   PSA 0.6 06/07/2021    CT HEAD WO CONTRAST  Result Date: 02/10/2018  [facility] [address], [address] [place], [place] [address] [phone] NEUROIMAGING REPORT STUDY DATE: 02/10/2018 PATIENT NAME: Blas Riches DOB: [birth year] MRN: [id number] EXAM: CT of the head without contrast ORDERING CLINICIAN: Levert Feinstein MD, PhD CLINICAL HISTORY: 57 year old man with headaches COMPARISON FILMS: None TECHNIQUE: CT scan of the head was obtained utilizing 5 mm axial slices from the skull base to the vertex. CONTRAST: None IMAGING SITE: [employer],  [address] FINDINGS: The fourth, third and lateral ventricles are normal in size and appearance.  No extra-axial fluid collections are seen.  No intracranial hemorrhage.  No evidence of  mass effect or midline shift.  There is a small hypodense focus in the left cerebellar hemisphere that could represent a chronic lacunar infarction.  The brainstem and deep gray matter appears normal. The hemispheres appear normal. The orbits and their contents, paranasal sinuses and calvarium are unremarkable.   The CT scan of the head without contrast shows the following: 1.   There is a single small hypodense focus in the left cerebellar hemisphere most consistent  with a chronic lacunar infarction.   No other abnormal foci are noted.. 2.   There are no acute findings. INTERPRETING PHYSICIAN: Richard A. Epimenio Foot, MD, PhD, FAAN Certified in  Neuroimaging by [employer]    Assessment & Plan:  Screen for STD (sexually transmitted disease) -     RPR; Future -     HIV Antibody (routine testing w rflx); Future -     C. trachomatis/N. gonorrhoeae RNA; Future  Vitamin B12 deficiency anemia due to intrinsic factor deficiency -     Vitamin B12; Future -     Folate; Future  Hyperlipidemia with target LDL less than 160 -     Lipoprotein A (LPA); Future -     Atorvastatin Calcium; Take 1 tablet (20 mg total) by mouth daily.  Dispense: 90 tablet; Refill: 1     Follow-up: Return in about 6 months (around 12/07/2023).  Sanda Linger, MD

## 2023-06-07 LAB — FOLATE: Folate: 7.6 ng/mL (ref >5.9)

## 2023-06-10 DIAGNOSIS — Z113 Encounter for screening for infections with a predominantly sexual mode of transmission: Secondary | ICD-10-CM | POA: Insufficient documentation

## 2023-06-10 MED ORDER — ATORVASTATIN CALCIUM 20 MG PO TABS
20.0000 mg | ORAL_TABLET | Freq: Every day | ORAL | 1 refills | Status: DC
Start: 2023-06-10 — End: 2023-12-27

## 2023-06-20 ENCOUNTER — Ambulatory Visit: Payer: Medicare Other | Admitting: Internal Medicine

## 2023-06-20 VITALS — BP 130/94 | HR 90 | Temp 99.0°F | Ht 69.0 in | Wt 175.0 lb

## 2023-06-20 DIAGNOSIS — I151 Hypertension secondary to other renal disorders: Secondary | ICD-10-CM

## 2023-06-20 DIAGNOSIS — D510 Vitamin B12 deficiency anemia due to intrinsic factor deficiency: Secondary | ICD-10-CM | POA: Diagnosis not present

## 2023-06-20 DIAGNOSIS — N529 Male erectile dysfunction, unspecified: Secondary | ICD-10-CM | POA: Diagnosis not present

## 2023-06-20 DIAGNOSIS — N183 Chronic kidney disease, stage 3 unspecified: Secondary | ICD-10-CM

## 2023-06-20 MED ORDER — TADALAFIL 20 MG PO TABS
10.0000 mg | ORAL_TABLET | ORAL | 11 refills | Status: DC | PRN
Start: 2023-06-20 — End: 2024-06-30

## 2023-06-20 NOTE — Progress Notes (Signed)
Patient ID: Jason Ware, male   DOB: Aug 04, 1966, 57 y.o.   MRN: [id number]        Chief Complaint: follow up worsening ED x 2 wks, htn, low vit b12, ckd3a       HPI:  Jason Ware is a 57 y.o. male here with new onset Ed symptoms in the past 2 wks, Pt denies chest pain, increased sob or doe, wheezing, orthopnea, PND, increased LE swelling, palpitations, dizziness or syncope.   Pt denies polydipsia, polyuria, or new focal neuro s/s.    Pt denies fever, wt loss, night sweats, loss of appetite, or other constitutional symptoms   Wt Readings from Last 3 Encounters:  06/20/23 175 lb (79.4 kg)  06/06/23 177 lb (80.3 kg)  01/16/23 188 lb 12.8 oz (85.6 kg)   BP Readings from Last 3 Encounters:  06/20/23 (!) 130/94  06/06/23 126/82  01/16/23 134/88         Past Medical History:  Diagnosis Date   Amyloidosis (HCC)    Anxiety    CHF (congestive heart failure) (HCC)    Depression    ICD (implantable cardioverter-defibrillator) in place    Migraine    Past Surgical History:  Procedure Laterality Date   HEART TRANSPLANT  2013   HERNIA REPAIR     LIVER TRANSPLANT  2013    reports that he has never smoked. He has never used smokeless tobacco. He reports that he does not drink alcohol and does not use drugs. family history includes Healthy in his father; Heart disease in his mother. No Known Allergies Current Outpatient Medications on File Prior to Visit  Medication Sig Dispense Refill   amitriptyline (ELAVIL) 25 MG tablet Take 1 tablet (25 mg total) by mouth at bedtime. 30 tablet 3   aspirin EC 81 MG tablet Take 81 mg by mouth every morning.     Atogepant (QULIPTA) 60 MG TABS Take 1 tablet by mouth daily. 48 tablet 0   atorvastatin (LIPITOR) 20 MG tablet Take 1 tablet (20 mg total) by mouth daily. 90 tablet 1   citalopram (CELEXA) 20 MG tablet Take 30 mg by mouth daily.      cycloSPORINE modified (NEORAL) 100 MG capsule Take 100 mg by mouth daily before breakfast.  3   cycloSPORINE  modified (NEORAL) 25 MG capsule Take 75 mg by mouth at bedtime.  3   diltiazem (DILACOR XR) 180 MG 24 hr capsule Take 180 mg by mouth daily.     magnesium oxide (MAG-OX) 400 MG tablet Take 800 mg by mouth daily.     mycophenolate (CELLCEPT) 500 MG tablet Take 1,000 mg by mouth 2 (two) times daily.     pantoprazole (PROTONIX) 40 MG tablet Take 40 mg by mouth daily.     No current facility-administered medications on file prior to visit.        ROS:  All others reviewed and negative.  Objective        PE:  BP (!) 130/94   Pulse 90   Temp 99 F (37.2 C)   Ht 5\' 9"  (1.753 m)   Wt 175 lb (79.4 kg)   SpO2 98%   BMI 25.84 kg/m                 Constitutional: Pt appears in NAD               HENT: Head: NCAT.  Right Ear: External ear normal.                 Left Ear: External ear normal.                Eyes: . Pupils are equal, round, and reactive to light. Conjunctivae and EOM are normal               Nose: without d/c or deformity               Neck: Neck supple. Gross normal ROM               Cardiovascular: Normal rate and regular rhythm.                 Pulmonary/Chest: Effort normal and breath sounds without rales or wheezing.                Abd:  Soft, NT, ND, + BS, no organomegaly               Neurological: Pt is alert. At baseline orientation, motor grossly intact               Skin: Skin is warm. No rashes, no other new lesions, LE edema - none               Psychiatric: Pt behavior is normal without agitation   Micro: none  Cardiac tracings I have personally interpreted today:  none  Pertinent Radiological findings (summarize): none   Lab Results  Component Value Date   WBC 4.2 03/26/2023   HGB 13.2 (A) 03/26/2023   HCT 38 (A) 03/26/2023   PLT 259 03/26/2023   GLUCOSE 90 08/31/2022   CHOL 152 12/03/2022   TRIG 63 12/03/2022   HDL 49 12/03/2022   LDLCALC 90 12/03/2022   ALT 23 03/26/2023   AST 31 03/26/2023   NA 140 03/26/2023   K 4.3 03/26/2023    CL 105 03/26/2023   CREATININE 1.5 (A) 03/26/2023   BUN 27 (A) 03/26/2023   CO2 27 (A) 03/26/2023   TSH 2.03 08/31/2022   PSA 0.7 06/06/2022   Assessment/Plan:  Jason Ware is a 57 y.o. Black or African American [2] male with  has a past medical history of Amyloidosis (HCC), Anxiety, CHF (congestive heart failure) (HCC), Depression, ICD (implantable cardioverter-defibrillator) in place, and Migraine.  Erectile dysfunction Mild to mod, for cialis prn, for testosterone if not improved,  to f/u any worsening symptoms or concerns  Vitamin B12 deficiency anemia due to intrinsic factor deficiency Lab Results  Component Value Date   VITAMINB12 219 06/06/2023   Low to start oral replacement - b12 1000 mcg qd   Stage 3 chronic kidney disease (HCC) Lab Results  Component Value Date   CREATININE 1.5 (A) 03/26/2023   Stable overall, cont to avoid nephrotoxins  Hypertension secondary to other renal disorders BP Readings from Last 3 Encounters:  06/20/23 (!) 130/94  06/06/23 126/82  01/16/23 134/88   Mild elevated likely situational with nervousness,, pt to continue medical treatment dilacor 180 qd  Followup: Return if symptoms worsen or fail to improve.  Oliver Barre, MD 06/21/2023 9:03 PM [facility]

## 2023-06-21 ENCOUNTER — Encounter: Payer: Self-pay | Admitting: Internal Medicine

## 2023-06-21 DIAGNOSIS — N529 Male erectile dysfunction, unspecified: Secondary | ICD-10-CM | POA: Insufficient documentation

## 2023-06-21 NOTE — Assessment & Plan Note (Signed)
BP Readings from Last 3 Encounters:  06/20/23 (!) 130/94  06/06/23 126/82  01/16/23 134/88   Mild elevated likely situational with nervousness,, pt to continue medical treatment dilacor 180 qd

## 2023-06-21 NOTE — Assessment & Plan Note (Signed)
Lab Results  Component Value Date   CREATININE 1.5 (A) 03/26/2023   Stable overall, cont to avoid nephrotoxins

## 2023-06-21 NOTE — Assessment & Plan Note (Signed)
Lab Results  Component Value Date   VITAMINB12 219 06/06/2023   Low to start oral replacement - b12 1000 mcg qd

## 2023-06-21 NOTE — Assessment & Plan Note (Signed)
Mild to mod, for cialis prn, for testosterone if not improved,  to f/u any worsening symptoms or concerns

## 2023-06-21 NOTE — Patient Instructions (Signed)
Please take all new medication as prescribed 

## 2023-06-26 ENCOUNTER — Ambulatory Visit (INDEPENDENT_AMBULATORY_CARE_PROVIDER_SITE_OTHER): Payer: Medicare Other

## 2023-06-26 VITALS — Ht 69.0 in | Wt 177.0 lb

## 2023-06-26 DIAGNOSIS — Z0000 Encounter for general adult medical examination without abnormal findings: Secondary | ICD-10-CM | POA: Diagnosis not present

## 2023-06-26 NOTE — Progress Notes (Signed)
Subjective:   Jason Ware is a 57 y.o. male who presents for an Initial Medicare Annual Wellness Visit.  Visit Complete: Virtual  I connected with  Jason Ware on 06/26/23 by a audio enabled telemedicine application and verified that I am speaking with the correct person using two identifiers.  Patient Location: Home  Provider Location: Other:  Office  I discussed the limitations of evaluation and management by telemedicine. The patient expressed understanding and agreed to proceed.  Vital Signs: Unable to obtain new vitals due to this being a telehealth visit. Patient provided weight for this visit.   Review of Systems     Cardiac Risk Factors include: advanced age (>42men, >92 women);dyslipidemia;family history of premature cardiovascular disease;hypertension;male gender;sedentary lifestyle     Objective:    Today's Vitals   06/26/23 1518  Weight: 177 lb (80.3 kg)  Height: 5\' 9"  (1.753 m)  PainSc: 0-No pain   Body mass index is 26.14 kg/m.     06/26/2023    3:19 PM 07/20/2017    1:44 PM  Advanced Directives  Does Patient Have a Medical Advance Directive? No No  Would patient like information on creating a medical advance directive? No - Patient declined     Current Medications (verified) Outpatient Encounter Medications as of 06/26/2023  Medication Sig   amitriptyline (ELAVIL) 25 MG tablet Take 1 tablet (25 mg total) by mouth at bedtime.   aspirin EC 81 MG tablet Take 81 mg by mouth every morning.   Atogepant (QULIPTA) 60 MG TABS Take 1 tablet by mouth daily.   atorvastatin (LIPITOR) 20 MG tablet Take 1 tablet (20 mg total) by mouth daily.   citalopram (CELEXA) 20 MG tablet Take 30 mg by mouth daily.    cycloSPORINE modified (NEORAL) 100 MG capsule Take 100 mg by mouth daily before breakfast.   cycloSPORINE modified (NEORAL) 25 MG capsule Take 75 mg by mouth at bedtime.   diltiazem (DILACOR XR) 180 MG 24 hr capsule Take 180 mg by mouth daily.   magnesium oxide  (MAG-OX) 400 MG tablet Take 800 mg by mouth daily.   mycophenolate (CELLCEPT) 500 MG tablet Take 1,000 mg by mouth 2 (two) times daily.   pantoprazole (PROTONIX) 40 MG tablet Take 40 mg by mouth daily.   tadalafil (CIALIS) 20 MG tablet Take 0.5-1 tablets (10-20 mg total) by mouth every other day as needed for erectile dysfunction.   No facility-administered encounter medications on file as of 06/26/2023.    Allergies (verified) Patient has no known allergies.   History: Past Medical History:  Diagnosis Date   Amyloidosis (HCC)    Anxiety    CHF (congestive heart failure) (HCC)    Depression    ICD (implantable cardioverter-defibrillator) in place    Migraine    Past Surgical History:  Procedure Laterality Date   HEART TRANSPLANT  2013   HERNIA REPAIR     LIVER TRANSPLANT  2013   Family History  Problem Relation Age of Onset   Heart disease Mother    Healthy Father    Social History   Socioeconomic History   Marital status: Divorced    Spouse name: Not on file   Number of children: 1   Years of education: college   Highest education level: 12th grade  Occupational History   Occupation: disability  Tobacco Use   Smoking status: Never   Smokeless tobacco: Never  Vaping Use   Vaping status: Never Used  Substance and Sexual Activity  Alcohol use: No   Drug use: No   Sexual activity: Not Currently    Partners: Female  Other Topics Concern   Not on file  Social History Narrative   Right-handed.   Rare use of caffeine.   Lives at home alone.   Social Determinants of Health   Financial Resource Strain: Patient Declined (06/26/2023)   Overall Financial Resource Strain (CARDIA)    Difficulty of Paying Living Expenses: Patient declined  Food Insecurity: Patient Declined (06/26/2023)   Hunger Vital Sign    Worried About Running Out of Food in the Last Year: Patient declined    Ran Out of Food in the Last Year: Patient declined  Transportation Needs: Unknown  (06/26/2023)   PRAPARE - Administrator, Civil Service (Medical): No    Lack of Transportation (Non-Medical): Patient declined  Physical Activity: Inactive (06/26/2023)   Exercise Vital Sign    Days of Exercise per Week: 0 days    Minutes of Exercise per Session: 0 min  Stress: Patient Declined (06/26/2023)   [employer] of Occupational Health - Occupational Stress Questionnaire    Feeling of Stress : Patient declined  Social Connections: Unknown (06/26/2023)   Social Connection and Isolation Panel [NHANES]    Frequency of Communication with Friends and Family: Patient declined    Frequency of Social Gatherings with Friends and Family: Patient declined    Attends Religious Services: Patient declined    Database administrator or Organizations: No    Attends Engineer, structural: Patient declined    Marital Status: Patient declined    Tobacco Counseling Counseling given: Not Answered   Clinical Intake:  Pre-visit preparation completed: Yes  Pain : No/denies pain Pain Score: 0-No pain (pt stated)     BMI - recorded: 26.14 Nutritional Status: BMI 25 -29 Overweight Nutritional Risks: None Diabetes: No  How often do you need to have someone help you when you read instructions, pamphlets, or other written materials from your doctor or pharmacy?: 1 - Never What is the last grade level you completed in school?: HSG  Interpreter Needed?: No  Information entered by ::  N. , LPN.   Activities of Daily Living    06/26/2023    3:22 PM  In your present state of health, do you have any difficulty performing the following activities:  Hearing? 0  Vision? 0  Difficulty concentrating or making decisions? 0  Walking or climbing stairs? 0  Dressing or bathing? 0  Doing errands, shopping? 0  Preparing Food and eating ? N  Using the Toilet? N  In the past six months, have you accidently leaked urine? N  Do you have problems with loss of bowel  control? N  Managing your Medications? N  Managing your Finances? N  Housekeeping or managing your Housekeeping? N    Patient Care Team: Etta Grandchild, MD as PCP - General (Internal Medicine) [facility] [place] as Consulting Physician (Optometry)  Indicate any recent Medical Services you may have received from other than [facility] providers in the past year (date may be approximate).     Assessment:   This is a routine wellness examination for [place].  Hearing/Vision screen Hearing Screening - Comments:: Patient denied any hearing difficulty.   No hearing aids.  Vision Screening - Comments:: Patient does wear corrective lenses/contacts.  Annual eye exam done by: [facility]   Dietary issues and exercise activities discussed:     Goals Addressed  This Visit's Progress    Patient Stated       Patient declined health goal at this time.      Depression Screen    06/26/2023    3:23 PM 06/20/2023   10:37 AM 01/16/2023    3:44 PM 08/30/2022    3:57 PM 06/15/2021    8:54 AM 04/10/2018    3:22 PM 07/20/2017    1:37 PM  PHQ 2/9 Scores  PHQ - 2 Score 0 0 0 0 0 0 0  PHQ- 9 Score 1   0       Fall Risk    06/26/2023    3:20 PM 06/20/2023   10:37 AM 01/16/2023    3:43 PM 04/10/2018    3:22 PM 07/20/2017    1:37 PM  Fall Risk   Falls in the past year? 0 0 0 No No  Number falls in past yr: 0  0    Injury with Fall? 0  0    Risk for fall due to : No Fall Risks  No Fall Risks    Follow up Falls prevention discussed  Falls evaluation completed      MEDICARE RISK AT HOME:  Medicare Risk at Home - 06/26/23 1520     Any stairs in or around the home? Yes    If so, are there any without handrails? No    Home free of loose throw rugs in walkways, pet beds, electrical cords, etc? Yes    Adequate lighting in your home to reduce risk of falls? Yes    Life alert? No    Use of a cane, walker or w/c? No    Grab bars in the bathroom? Yes    Shower  chair or bench in shower? No    Elevated toilet seat or a handicapped toilet? No             TIMED UP AND GO:  Was the test performed? No    Cognitive Function:        06/26/2023    3:23 PM  6CIT Screen  What Year? 0 points  What month? 0 points  What time? 0 points  Count back from 20 0 points  Months in reverse 0 points  Repeat phrase 0 points  Total Score 0 points    Immunizations Immunization History  Administered Date(s) Administered   Covid-19, Mrna,Vaccine(Spikevax)4yrs and older 12/18/2022   Influenza,inj,Quad PF,6+ Mos 08/05/2017   Influenza-Unspecified 12/18/2022   PFIZER(Purple Top)SARS-COV-2 Vaccination 02/05/2020, 02/26/2020, 11/24/2020   Pneumococcal Conjugate-13 08/03/2015   Tdap 06/15/2021    TDAP status: Up to date  Flu Vaccine status: Up to date  Pneumococcal vaccine status: Due at age 47  Covid-19 vaccine status: Completed vaccines  Qualifies for Shingles Vaccine? No   Zostavax completed No   Shingrix Completed?: No.    Education has been provided regarding the importance of this vaccine. Patient has been advised to call insurance company to determine out of pocket expense if they have not yet received this vaccine. Advised may also receive vaccine at local pharmacy or Health Dept. Verbalized acceptance and understanding.  Screening Tests Health Maintenance  Topic Date Due   Zoster Vaccines- Shingrix (1 of 2) Never done   COVID-19 Vaccine (5 - 2023-24 season) 02/12/2023   INFLUENZA VACCINE  06/13/2023   Medicare Annual Wellness (AWV)  06/25/2024   Fecal DNA (Cologuard)  07/06/2024   DTaP/Tdap/Td (2 - Td or Tdap) 06/16/2031   Hepatitis C Screening  Completed   HIV Screening  Completed   HPV VACCINES  Aged Out    Health Maintenance  Health Maintenance Due  Topic Date Due   Zoster Vaccines- Shingrix (1 of 2) Never done   COVID-19 Vaccine (5 - 2023-24 season) 02/12/2023   INFLUENZA VACCINE  06/13/2023    Colorectal cancer  screening: Type of screening: Cologuard. Completed 07/13/2021. Repeat every 3 years  Lung Cancer Screening: (Low Dose CT Chest recommended if Age 17-80 years, 20 pack-year currently smoking OR have quit w/in 15years.) does not qualify.   Lung Cancer Screening Referral: no  Additional Screening:  Hepatitis C Screening: does qualify; Completed 08/01/2015  Vision Screening: Recommended annual ophthalmology exams for early detection of glaucoma and other disorders of the eye. Is the patient up to date with their annual eye exam?  Yes  Who is the provider or what is the name of the office in which the patient attends annual eye exams? [facility] If pt is not established with a provider, would they like to be referred to a provider to establish care? No .   Dental Screening: Recommended annual dental exams for proper oral hygiene  Diabetic Foot Exam: N/A  Community Resource Referral / Chronic Care Management: CRR required this visit?  No   CCM required this visit?  No    Plan:     I have personally reviewed and noted the following in the patient's chart:   Medical and social history Use of alcohol, tobacco or illicit drugs  Current medications and supplements including opioid prescriptions. Patient is not currently taking opioid prescriptions. Functional ability and status Nutritional status Physical activity Advanced directives List of other physicians Hospitalizations, surgeries, and ER visits in previous 12 months Vitals Screenings to include cognitive, depression, and falls Referrals and appointments  In addition, I have reviewed and discussed with patient certain preventive protocols, quality metrics, and best practice recommendations. A written personalized care plan for preventive services as well as general preventive health recommendations were provided to patient.     Mickeal Needy, LPN   1/61/0960   After Visit Summary: (Mail) Due to this being a  telephonic visit, the after visit summary with patients personalized plan was offered to patient via mail   Nurse Notes: Normal cognitive status assessed by direct observation via telephone conversation by this Nurse Health Advisor. No abnormalities found.

## 2023-06-26 NOTE — Patient Instructions (Addendum)
[name] , Thank you for taking time to come for your Medicare Wellness Visit. I appreciate your ongoing commitment to your health goals. Please review the following plan we discussed and let me know if I can assist you in the future.   Referrals/Orders/Follow-Ups/Clinician Recommendations: No  This is a list of the screening recommended for you and due dates:  Health Maintenance  Topic Date Due   Zoster (Shingles) Vaccine (1 of 2) Never done   COVID-19 Vaccine (5 - 2023-24 season) 02/12/2023   Flu Shot  06/13/2023   Medicare Annual Wellness Visit  06/25/2024   Cologuard (Stool DNA test)  07/06/2024   DTaP/Tdap/Td vaccine (2 - Td or Tdap) 06/16/2031   Hepatitis C Screening  Completed   HIV Screening  Completed   HPV Vaccine  Aged Out    Advanced directives: (Declined) Advance directive discussed with you today. Even though you declined this today, please call our office should you change your mind, and we can give you the proper paperwork for you to fill out.  Next Medicare Annual Wellness Visit scheduled for next year: Yes  Preventive Care 40-64 Years, Male Preventive care refers to lifestyle choices and visits with your health care provider that can promote health and wellness. What does preventive care include? A yearly physical exam. This is also called an annual well check. Dental exams once or twice a year. Routine eye exams. Ask your health care provider how often you should have your eyes checked. Personal lifestyle choices, including: Daily care of your teeth and gums. Regular physical activity. Eating a healthy diet. Avoiding tobacco and drug use. Limiting alcohol use. Practicing safe sex. Taking low-dose aspirin every day starting at age 29. What happens during an annual well check? The services and screenings done by your health care provider during your annual well check will depend on your age, overall health, lifestyle risk factors, and family history of  disease. Counseling  Your health care provider may ask you questions about your: Alcohol use. Tobacco use. Drug use. Emotional well-being. Home and relationship well-being. Sexual activity. Eating habits. Work and work Astronomer. Screening  You may have the following tests or measurements: Height, weight, and BMI. Blood pressure. Lipid and cholesterol levels. These may be checked every 5 years, or more frequently if you are over 90 years old. Skin check. Lung cancer screening. You may have this screening every year starting at age 25 if you have a 30-pack-year history of smoking and currently smoke or have quit within the past 15 years. Fecal occult blood test (FOBT) of the stool. You may have this test every year starting at age 49. Flexible sigmoidoscopy or colonoscopy. You may have a sigmoidoscopy every 5 years or a colonoscopy every 10 years starting at age 55. Prostate cancer screening. Recommendations will vary depending on your family history and other risks. Hepatitis C blood test. Hepatitis B blood test. Sexually transmitted disease (STD) testing. Diabetes screening. This is done by checking your blood sugar (glucose) after you have not eaten for a while (fasting). You may have this done every 1-3 years. Discuss your test results, treatment options, and if necessary, the need for more tests with your health care provider. Vaccines  Your health care provider may recommend certain vaccines, such as: Influenza vaccine. This is recommended every year. Tetanus, diphtheria, and acellular pertussis (Tdap, Td) vaccine. You may need a Td booster every 10 years. Zoster vaccine. You may need this after age 30. Pneumococcal 13-valent conjugate (PCV13)  vaccine. You may need this if you have certain conditions and have not been vaccinated. Pneumococcal polysaccharide (PPSV23) vaccine. You may need one or two doses if you smoke cigarettes or if you have certain conditions. Talk to your  health care provider about which screenings and vaccines you need and how often you need them. This information is not intended to replace advice given to you by your health care provider. Make sure you discuss any questions you have with your health care provider. Document Released: 11/25/2015 Document Revised: 07/18/2016 Document Reviewed: 08/30/2015 Elsevier Interactive Patient Education  2017 [employer].  Fall Prevention in the Home Falls can cause injuries. They can happen to people of all ages. There are many things you can do to make your home safe and to help prevent falls. What can I do on the outside of my home? Regularly fix the edges of walkways and driveways and fix any cracks. Remove anything that might make you trip as you walk through a door, such as a raised step or threshold. Trim any bushes or trees on the path to your home. Use bright outdoor lighting. Clear any walking paths of anything that might make someone trip, such as rocks or tools. Regularly check to see if handrails are loose or broken. Make sure that both sides of any steps have handrails. Any raised decks and porches should have guardrails on the edges. Have any leaves, snow, or ice cleared regularly. Use sand or salt on walking paths during winter. Clean up any spills in your garage right away. This includes oil or grease spills. What can I do in the bathroom? Use night lights. Install grab bars by the toilet and in the tub and shower. Do not use towel bars as grab bars. Use non-skid mats or decals in the tub or shower. If you need to sit down in the shower, use a plastic, non-slip stool. Keep the floor dry. Clean up any water that spills on the floor as soon as it happens. Remove soap buildup in the tub or shower regularly. Attach bath mats securely with double-sided non-slip rug tape. Do not have throw rugs and other things on the floor that can make you trip. What can I do in the bedroom? Use night  lights. Make sure that you have a light by your bed that is easy to reach. Do not use any sheets or blankets that are too big for your bed. They should not hang down onto the floor. Have a firm chair that has side arms. You can use this for support while you get dressed. Do not have throw rugs and other things on the floor that can make you trip. What can I do in the kitchen? Clean up any spills right away. Avoid walking on wet floors. Keep items that you use a lot in easy-to-reach places. If you need to reach something above you, use a strong step stool that has a grab bar. Keep electrical cords out of the way. Do not use floor polish or wax that makes floors slippery. If you must use wax, use non-skid floor wax. Do not have throw rugs and other things on the floor that can make you trip. What can I do with my stairs? Do not leave any items on the stairs. Make sure that there are handrails on both sides of the stairs and use them. Fix handrails that are broken or loose. Make sure that handrails are as long as the stairways. Check any carpeting  to make sure that it is firmly attached to the stairs. Fix any carpet that is loose or worn. Avoid having throw rugs at the top or bottom of the stairs. If you do have throw rugs, attach them to the floor with carpet tape. Make sure that you have a light switch at the top of the stairs and the bottom of the stairs. If you do not have them, ask someone to add them for you. What else can I do to help prevent falls? Wear shoes that: Do not have high heels. Have rubber bottoms. Are comfortable and fit you well. Are closed at the toe. Do not wear sandals. If you use a stepladder: Make sure that it is fully opened. Do not climb a closed stepladder. Make sure that both sides of the stepladder are locked into place. Ask someone to hold it for you, if possible. Clearly mark and make sure that you can see: Any grab bars or handrails. First and last  steps. Where the edge of each step is. Use tools that help you move around (mobility aids) if they are needed. These include: Canes. Walkers. Scooters. Crutches. Turn on the lights when you go into a dark area. Replace any light bulbs as soon as they burn out. Set up your furniture so you have a clear path. Avoid moving your furniture around. If any of your floors are uneven, fix them. If there are any pets around you, be aware of where they are. Review your medicines with your doctor. Some medicines can make you feel dizzy. This can increase your chance of falling. Ask your doctor what other things that you can do to help prevent falls. This information is not intended to replace advice given to you by your health care provider. Make sure you discuss any questions you have with your health care provider. Document Released: 08/25/2009 Document Revised: 04/05/2016 Document Reviewed: 12/03/2014 Elsevier Interactive Patient Education  2017 [employer].

## 2023-07-03 DIAGNOSIS — Z941 Heart transplant status: Secondary | ICD-10-CM | POA: Diagnosis not present

## 2023-07-03 DIAGNOSIS — Z13220 Encounter for screening for lipoid disorders: Secondary | ICD-10-CM | POA: Diagnosis not present

## 2023-12-25 ENCOUNTER — Ambulatory Visit (INDEPENDENT_AMBULATORY_CARE_PROVIDER_SITE_OTHER): Payer: Medicare Other | Admitting: Internal Medicine

## 2023-12-25 ENCOUNTER — Encounter: Payer: Self-pay | Admitting: Internal Medicine

## 2023-12-25 VITALS — BP 122/78 | HR 77 | Temp 97.6°F | Resp 16 | Ht 69.0 in | Wt 180.2 lb

## 2023-12-25 DIAGNOSIS — N183 Chronic kidney disease, stage 3 unspecified: Secondary | ICD-10-CM | POA: Diagnosis not present

## 2023-12-25 DIAGNOSIS — D510 Vitamin B12 deficiency anemia due to intrinsic factor deficiency: Secondary | ICD-10-CM | POA: Diagnosis not present

## 2023-12-25 DIAGNOSIS — E850 Non-neuropathic heredofamilial amyloidosis: Secondary | ICD-10-CM | POA: Diagnosis not present

## 2023-12-25 DIAGNOSIS — Z23 Encounter for immunization: Secondary | ICD-10-CM | POA: Diagnosis not present

## 2023-12-25 DIAGNOSIS — I151 Hypertension secondary to other renal disorders: Secondary | ICD-10-CM | POA: Diagnosis not present

## 2023-12-25 DIAGNOSIS — E785 Hyperlipidemia, unspecified: Secondary | ICD-10-CM

## 2023-12-25 DIAGNOSIS — L989 Disorder of the skin and subcutaneous tissue, unspecified: Secondary | ICD-10-CM | POA: Insufficient documentation

## 2023-12-25 LAB — CBC WITH DIFFERENTIAL/PLATELET
Basophils Absolute: 0 10*3/uL (ref 0.0–0.1)
Basophils Relative: 0.6 % (ref 0.0–3.0)
Eosinophils Absolute: 0 10*3/uL (ref 0.0–0.7)
Eosinophils Relative: 1 % (ref 0.0–5.0)
HCT: 39.2 % (ref 39.0–52.0)
Hemoglobin: 13 g/dL (ref 13.0–17.0)
Lymphocytes Relative: 19.7 % (ref 12.0–46.0)
Lymphs Abs: 0.8 10*3/uL (ref 0.7–4.0)
MCHC: 33.1 g/dL (ref 30.0–36.0)
MCV: 91.1 fL (ref 78.0–100.0)
Monocytes Absolute: 0.4 10*3/uL (ref 0.1–1.0)
Monocytes Relative: 10.2 % (ref 3.0–12.0)
Neutro Abs: 2.9 10*3/uL (ref 1.4–7.7)
Neutrophils Relative %: 68.5 % (ref 43.0–77.0)
Platelets: 239 10*3/uL (ref 150.0–400.0)
RBC: 4.3 Mil/uL (ref 4.22–5.81)
RDW: 14.4 % (ref 11.5–15.5)
WBC: 4.3 10*3/uL (ref 4.0–10.5)

## 2023-12-25 LAB — FOLATE: Folate: 7.3 ng/mL (ref >5.9)

## 2023-12-25 LAB — BASIC METABOLIC PANEL
BUN: 25 mg/dL — ABNORMAL HIGH (ref 6–23)
CO2: 29 meq/L (ref 19–32)
Calcium: 9.4 mg/dL (ref 8.4–10.5)
Chloride: 104 meq/L (ref 96–112)
Creatinine, Ser: 1.26 mg/dL (ref 0.40–1.50)
GFR: 63.42 mL/min (ref >60.00)
Glucose, Bld: 89 mg/dL (ref 70–99)
Potassium: 4 meq/L (ref 3.5–5.1)
Sodium: 140 meq/L (ref 135–145)

## 2023-12-25 LAB — LIPID PANEL
Cholesterol: 136 mg/dL (ref 0–200)
HDL: 54 mg/dL (ref >39.00)
LDL Cholesterol: 69 mg/dL (ref 0–99)
NonHDL: 82.39
Total CHOL/HDL Ratio: 3
Triglycerides: 65 mg/dL (ref 0.0–149.0)
VLDL: 13 mg/dL (ref 0.0–40.0)

## 2023-12-25 LAB — TSH: TSH: 2.55 u[IU]/mL (ref 0.35–5.50)

## 2023-12-25 LAB — VITAMIN B12: Vitamin B-12: 170 pg/mL — ABNORMAL LOW (ref 211–911)

## 2023-12-25 NOTE — Patient Instructions (Signed)
Chronic Kidney Disease in Adults: What to Know Chronic kidney disease (CKD) is when lasting damage happens to the kidneys slowly over time. The kidneys are two organs that do many important things in the body. These include: Taking waste and extra fluid out of the blood to make pee (urine). Making hormones. Keeping the right amount of fluids and chemicals in the body. A small amount of kidney damage may not cause problems. You must take steps to help keep the kidney damage from getting worse. A lot of damage may cause kidney failure. Kidney failure means the kidneys can no longer work right. What are the causes? Diabetes. High blood pressure. Diseases that affect the heart and blood vessels. Other kidney diseases. Diseases that affect the body's defense system (immune system). A problem with the flow of pee. This may be caused by: Kidney stones. Cancer. An enlarged prostate, in males. A kidney infection or urinary tract infection (UTI) that keeps coming back. What increases the risk? Getting older. The chances of having CKD increase with age. A family history of kidney disease or kidney failure. Having a disease caused by genes. Taking medicines that can harm the kidneys. Being near or having contact with harmful substances. Being very overweight. Using tobacco now or in the past. What are the signs or symptoms? Common symptoms of CKD include: Feeling very tired and having less energy. Swelling of the face, legs, ankles, or feet. Throwing up or feeling like you may throw up. Not wanting to eat as much as normal. Being confused or not able to focus. Twitches and cramps in the leg muscles or other muscles. Dry, itchy skin. Other symptoms may include: Shortness of breath. Trouble sleeping. Making less pee, or making more pee, especially at night. A taste of metal in your mouth. You may also become anemic. Anemia means there's not enough red blood cells in your blood. You may get  symptoms slowly. You may not notice them until the kidney damage gets very bad. How is this diagnosed? CKD may be diagnosed based on: Tests on your blood or pee. Imaging tests, like an ultrasound or a CT scan. A kidney biopsy. For this test, a sample of kidney tissue is removed to be looked at under a microscope. These tests will help to find out how serious the CKD is. How is this treated? Often, there's no cure for CKD. Treatment can help with symptoms and help keep the disease from getting worse. Treatment may include: Treating other problems that are causing your CKD or making it worse. Diet changes. You may need to: Avoid alcohol. Avoid foods that are high in salt, potassium, phosphorous, and protein. Taking medicines for symptoms and to help control other conditions. Dialysis. This treatment gets harmful waste out of your body. It may be needed if you have kidney failure. Follow these instructions at home: Medicines Take your medicines only as told. The amount of some medicines you take may need to be changed. Do not take any new medicines, vitamins, or supplements unless your health care provider says it's okay. These may make kidney damage worse. Lifestyle Do not smoke, vape, or use nicotine or tobacco. If you drink alcohol: Limit how much you have to: 0-1 drink a day if you're male. 0-2 drinks a day if you're male. Know how much alcohol is in your drink. In the [place] one drink is one 12 oz bottle of beer (355 mL), one 5 oz glass of wine (148 mL), or one 1 oz  glass of hard liquor (44 mL). Stay at a healthy weight. If you need help, ask your provider. General instructions  Eat and drink as told. Track your blood pressure at home. Tell your provider about any changes. If you have diabetes, track your blood sugar as told. Exercise at least 30 minutes a day, 5 days a week. Keep your shots (vaccinations) up to date. Keep all follow-up visits. Your provider may need to change  your treatments over time. Where to find support American Kidney Fund: [place].com.au Kidney School: kidneyschool.org American Association of Kidney Patients: https://www.miller-montoya.com/ Where to find more information National Kidney Foundation: kidney.org Centers for Disease Control and Prevention. To learn more: Go to DiningCalendar.de. Click "Search". Type "chronic kidney disease" in the search box. Contact a health care provider if: You have new symptoms. You get symptoms of end-stage kidney disease. These include: Headaches. Numbness in your hands or feet. Leg cramps. Easy bruising. Get help right away if: You have a fever. You make less pee than usual. You have pain or bleeding when you pee or poop. You have chest pain. You have shortness of breath. These symptoms may be an emergency. Call 911 right away. Do not wait to see if the symptoms will go away. Do not drive yourself to the hospital. This information is not intended to replace advice given to you by your health care provider. Make sure you discuss any questions you have with your health care provider. Document Revised: 09/10/2023 Document Reviewed: 05/03/2023 Elsevier Patient Education  2024 [employer].

## 2023-12-25 NOTE — Progress Notes (Signed)
Ask him to come in for a B12 injection

## 2023-12-25 NOTE — Progress Notes (Signed)
 Subjective:  Patient ID: Jason Ware, male    DOB: 1966-04-15  Age: 58 y.o. MRN: [id number]  CC: Hypertension and Anemia   HPI DECARLOS EMPEY presents for f/up ----  Discussed the use of AI scribe software for clinical note transcription with the patient, who gave verbal consent to proceed.  History of Present Illness   Jason Ware is a 58 year old male who presents with a lesion under his chin. He was advised to contact Dr. Langston Reusing for a dermatological evaluation.  He has had a lesion under his chin for approximately two months. The lesion bleeds but is not very painful. He initially thought it would resolve on its own, but it has persisted.  He has previously received flu shots and there is no contraindication with his immunosuppressant medication.       Outpatient Medications Prior to Visit  Medication Sig Dispense Refill   amitriptyline (ELAVIL) 25 MG tablet Take 1 tablet (25 mg total) by mouth at bedtime. 30 tablet 3   aspirin EC 81 MG tablet Take 81 mg by mouth every morning.     Atogepant (QULIPTA) 60 MG TABS Take 1 tablet by mouth daily. 48 tablet 0   citalopram (CELEXA) 20 MG tablet Take 30 mg by mouth daily.      cycloSPORINE modified (NEORAL) 100 MG capsule Take 100 mg by mouth daily before breakfast.  3   cycloSPORINE modified (NEORAL) 25 MG capsule Take 75 mg by mouth at bedtime.  3   diltiazem (DILACOR XR) 180 MG 24 hr capsule Take 180 mg by mouth daily.     magnesium oxide (MAG-OX) 400 MG tablet Take 800 mg by mouth daily.     mycophenolate (CELLCEPT) 500 MG tablet Take 1,000 mg by mouth 2 (two) times daily.     pantoprazole (PROTONIX) 40 MG tablet Take 40 mg by mouth daily.     tadalafil (CIALIS) 20 MG tablet Take 0.5-1 tablets (10-20 mg total) by mouth every other day as needed for erectile dysfunction. 10 tablet 11   atorvastatin (LIPITOR) 20 MG tablet Take 1 tablet (20 mg total) by mouth daily. 90 tablet 1   No facility-administered medications prior  to visit.    ROS Review of Systems  Constitutional: Negative.  Negative for chills, diaphoresis, fatigue and fever.  HENT: Negative.    Eyes: Negative.   Respiratory: Negative.  Negative for cough, chest tightness, shortness of breath and wheezing.   Cardiovascular:  Negative for chest pain, palpitations and leg swelling.  Gastrointestinal:  Negative for abdominal pain, constipation, diarrhea, nausea and vomiting.  Genitourinary: Negative.  Negative for difficulty urinating.  Musculoskeletal: Negative.  Negative for arthralgias and myalgias.  Skin: Negative.   Neurological:  Negative for dizziness, weakness and light-headedness.  Hematological:  Negative for adenopathy. Does not bruise/bleed easily.  Psychiatric/Behavioral: Negative.      Objective:  BP 122/78 (BP Location: Left Arm, Patient Position: Sitting, Cuff Size: Normal)   Pulse 77   Temp 97.6 F (36.4 C) (Oral)   Resp 16   Ht 5\' 9"  (1.753 m)   Wt 180 lb 3.2 oz (81.7 kg)   SpO2 97%   BMI 26.61 kg/m   BP Readings from Last 3 Encounters:  12/25/23 122/78  06/20/23 (!) 130/94  06/06/23 126/82    Wt Readings from Last 3 Encounters:  12/25/23 180 lb 3.2 oz (81.7 kg)  06/26/23 177 lb (80.3 kg)  06/20/23 175 lb (79.4 kg)  Physical Exam Vitals reviewed.  Constitutional:      Appearance: Normal appearance.  HENT:     Head:     Comments: 2 fleshy hypertrophic lesions under the chin    Nose: Nose normal.     Mouth/Throat:     Mouth: Mucous membranes are moist.  Eyes:     General: No scleral icterus.    Conjunctiva/sclera: Conjunctivae normal.  Cardiovascular:     Rate and Rhythm: Normal rate and regular rhythm.     Heart sounds: No murmur heard. Pulmonary:     Effort: Pulmonary effort is normal.     Breath sounds: No stridor. No wheezing, rhonchi or rales.  Abdominal:     General: Abdomen is flat.     Palpations: There is no mass.     Tenderness: There is no abdominal tenderness. There is no guarding.      Hernia: No hernia is present.  Musculoskeletal:        General: Normal range of motion.     Cervical back: Neck supple.     Right lower leg: No edema.     Left lower leg: No edema.  Lymphadenopathy:     Cervical: No cervical adenopathy.  Skin:    Findings: Lesion present. No rash.  Neurological:     General: No focal deficit present.     Mental Status: He is alert. Mental status is at baseline.  Psychiatric:        Mood and Affect: Mood normal.        Behavior: Behavior normal.     Lab Results  Component Value Date   WBC 4.3 12/25/2023   HGB 13.0 12/25/2023   HCT 39.2 12/25/2023   PLT 239.0 12/25/2023   GLUCOSE 89 12/25/2023   CHOL 136 12/25/2023   TRIG 65.0 12/25/2023   HDL 54.00 12/25/2023   LDLCALC 69 12/25/2023   ALT 23 03/26/2023   AST 31 03/26/2023   NA 140 12/25/2023   K 4.0 12/25/2023   CL 104 12/25/2023   CREATININE 1.26 12/25/2023   BUN 25 (H) 12/25/2023   CO2 29 12/25/2023   TSH 2.55 12/25/2023   PSA 0.7 06/06/2022    CT HEAD WO CONTRAST Result Date: 02/10/2018  [facility] [address], [address] [place], [place] [address] [phone] NEUROIMAGING REPORT STUDY DATE: 02/10/2018 PATIENT NAME: Jason Ware DOB: July 11, 1966 MRN: [id number] EXAM: CT of the head without contrast ORDERING CLINICIAN: Levert Feinstein MD, PhD CLINICAL HISTORY: 58 year old man with headaches COMPARISON FILMS: None TECHNIQUE: CT scan of the head was obtained utilizing 5 mm axial slices from the skull base to the vertex. CONTRAST: None IMAGING SITE: [employer],  [address] FINDINGS: The fourth, third and lateral ventricles are normal in size and appearance.  No extra-axial fluid collections are seen.  No intracranial hemorrhage.  No evidence of mass effect or midline shift.  There is a small hypodense focus in the left cerebellar hemisphere that could represent a chronic lacunar infarction.  The brainstem and deep gray matter appears normal. The hemispheres appear  normal. The orbits and their contents, paranasal sinuses and calvarium are unremarkable.   The CT scan of the head without contrast shows the following: 1.   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   No other abnormal foci are noted.. 2.   There are no acute findings. INTERPRETING PHYSICIAN: Richard A. 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  Hypertension secondary to other renal disorders -     Basic metabolic panel; Future  Stage 3 chronic kidney disease, unspecified whether stage 3a or 3b CKD (HCC)- Renal function has improved. -     Basic metabolic panel; Future  Vitamin B12 deficiency anemia due to intrinsic factor deficiency -     Vitamin B12; Future -     CBC with Differential/Platelet; Future -     Folate; Future  Hyperlipidemia with target LDL less than 160- LDL goal achieved. Doing well on the statin  -     Lipid panel; Future -     TSH; Future  Other orders -     Pneumococcal polysaccharide vaccine 23-valent greater than or equal to 2yo subcutaneous/IM     Follow-up: Return in about 6 months (around 06/23/2024).  Sanda Linger, MD

## 2023-12-27 ENCOUNTER — Ambulatory Visit: Payer: Medicare Other

## 2023-12-27 DIAGNOSIS — D510 Vitamin B12 deficiency anemia due to intrinsic factor deficiency: Secondary | ICD-10-CM

## 2023-12-27 MED ORDER — CYANOCOBALAMIN 1000 MCG/ML IJ SOLN
1000.0000 ug | Freq: Once | INTRAMUSCULAR | Status: AC
  Administered 2023-12-27: 1000 ug via INTRAMUSCULAR

## 2023-12-27 MED ORDER — ATORVASTATIN CALCIUM 20 MG PO TABS
20.0000 mg | ORAL_TABLET | Freq: Every day | ORAL | 1 refills | Status: AC
Start: 2023-12-27 — End: ?

## 2023-12-27 NOTE — Progress Notes (Signed)
Patient here for first b-12 injection. Tolerated well with no complications

## 2023-12-30 DIAGNOSIS — N183 Chronic kidney disease, stage 3 unspecified: Secondary | ICD-10-CM | POA: Diagnosis not present

## 2023-12-30 DIAGNOSIS — Z23 Encounter for immunization: Secondary | ICD-10-CM | POA: Diagnosis not present

## 2024-01-15 ENCOUNTER — Ambulatory Visit: Payer: Medicare Other | Admitting: Dermatology

## 2024-01-15 ENCOUNTER — Encounter: Payer: Self-pay | Admitting: Dermatology

## 2024-01-15 VITALS — BP 131/87

## 2024-01-15 DIAGNOSIS — L739 Follicular disorder, unspecified: Secondary | ICD-10-CM

## 2024-01-15 DIAGNOSIS — L910 Hypertrophic scar: Secondary | ICD-10-CM

## 2024-01-15 MED ORDER — TRIAMCINOLONE ACETONIDE 40 MG/ML IJ SUSP
40.0000 mg | Freq: Once | INTRAMUSCULAR | Status: AC
  Administered 2024-01-15: 40 mg via INTRAMUSCULAR

## 2024-01-15 MED ORDER — ADAPALENE 0.1 % EX CREA: TOPICAL_CREAM | CUTANEOUS | 0 refills | Status: DC

## 2024-01-15 NOTE — Patient Instructions (Addendum)
 Hello Jason Ware,  Thank you for visiting [place] today.   Here is a summary of the key instructions from today's consultation:  Kenalog Injections: Administered today to reduce the size of the bumps in your beard area. Each of the two bumps received an injection of 0.05 ml of Kenalog 40.  Adapalene 0.3% Cream: Prescribed to prevent new bumps.   Application: Apply a pea-sized amount mixed with a nickel-sized amount of lotion to the affected area every other night. Wash off in the morning.   Adjustment: If irritation occurs, reduce application to every second night.  Follow-Up Appointment: Scheduled in approximately 3-4 months to assess progress and determine if additional injections are necessary.  General Care: Avoid picking at the injection sites to prevent squeezing out the medication.  Please follow these instructions carefully and do not hesitate to contact our office if you have any questions or concerns. We look forward to seeing the improvements at your next visit.  Best regards,  Dr. Langston Reusing Dermatology    Important Information  Due to recent changes in healthcare laws, you may see results of your pathology and/or laboratory studies on MyChart before the doctors have had a chance to review them. We understand that in some cases there may be results that are confusing or concerning to you. Please understand that not all results are received at the same time and often the doctors may need to interpret multiple results in order to provide you with the best plan of care or course of treatment. Therefore, we ask that you please give [place] 2 business days to thoroughly review all your results before contacting the office for clarification. Should we see a critical lab result, you will be contacted sooner.   If You Need Anything After Your Visit  If you have any questions or concerns for your doctor, please call our main line at [phone] If no one answers, please leave a voicemail  as directed and we will return your call as soon as possible. Messages left after 4 pm will be answered the following business day.   You may also send [place] a message via MyChart. We typically respond to MyChart messages within 1-2 business days.  For prescription refills, please ask your pharmacy to contact our office. Our fax number is [phone].  If you have an urgent issue when the clinic is closed that cannot wait until the next business day, you can page your doctor at the number below.    Please note that while we do our best to be available for urgent issues outside of office hours, we are not available 24/7.   If you have an urgent issue and are unable to reach [place], you may choose to seek medical care at your doctor's office, retail clinic, urgent care center, or emergency room.  If you have a medical emergency, please immediately call 911 or go to the emergency department. In the event of inclement weather, please call our main line at [phone] for an update on the status of any delays or closures.  Dermatology Medication Tips: Please keep the boxes that topical medications come in in order to help keep track of the instructions about where and how to use these. Pharmacies typically print the medication instructions only on the boxes and not directly on the medication tubes.   If your medication is too expensive, please contact our office at [phone] or send [place] a message through MyChart.   We are unable to tell what your  co-pay for medications will be in advance as this is different depending on your insurance coverage. However, we may be able to find a substitute medication at lower cost or fill out paperwork to get insurance to cover a needed medication.   If a prior authorization is required to get your medication covered by your insurance company, please allow [place] 1-2 business days to complete this process.  Drug prices often vary depending on where the prescription is  filled and some pharmacies may offer cheaper prices.  The website www.goodrx.com contains coupons for medications through different pharmacies. The prices here do not account for what the cost may be with help from insurance (it may be cheaper with your insurance), but the website can give you the price if you did not use any insurance.  - You can print the associated coupon and take it with your prescription to the pharmacy.  - You may also stop by our office during regular business hours and pick up a GoodRx coupon card.  - If you need your prescription sent electronically to a different pharmacy, notify our office through [facility] or by phone at [phone]

## 2024-01-15 NOTE — Progress Notes (Signed)
   New Patient Visit   Subjective  Jason Ware is a 58 y.o. male who presents for the following: New Pt - Spot Check  Patient states he  has spot located at the chin that he  would like to have examined. Patient reports the areas have been there for 6 months. He reports the areas are bothersome at times. Patient rates irritation 3 out of 10. He states that the areas have not spread. Patient reports he  has previously been treated for these areas by PCP and was provided with a referral to specialist. Patient denied Hx of bx. Patient denied family history of skin cancer(s).   The following portions of the chart were reviewed this encounter and updated as appropriate: medications, allergies, medical history  Review of Systems:  No other skin or systemic complaints except as noted in HPI or Assessment and Plan.  Objective  Well appearing patient in no apparent distress; mood and affect are within normal limits.   A focused examination was performed of the following areas: face   Relevant exam findings are noted in the Assessment and Plan.         Right Submental Area Procedure Note Intralesional Injection  Location: chin  Informed Consent: Discussed risks (infection, pain, bleeding, bruising, thinning of the skin, loss of skin pigment, lack of resolution, and recurrence of lesion) and benefits of the procedure, as well as the alternatives. Informed consent was obtained. Preparation: The area was prepared a standard fashion.  Anesthesia: n/a  Procedure Details: An intralesional injection was performed with Kenalog 40 mg/cc. 0.1 cc in total were injected. 0.5cc injected into each bump. NDC #: 1610-9604-54 Exp: 03/2025  Total number of injections: 2  Plan: The patient was instructed on post-op care. Recommend OTC analgesia as needed for pain.   Assessment & Plan   FOLLICULITIS BARBAE Exam: Perifollicular erythematous papules and pustules  Folliculitis occurs due to  inflammation of the superficial hair follicle (pore), resulting in acne-like lesions (pus bumps). It can be infectious (bacterial, fungal) or noninfectious (shaving, tight clothing, heat/sweat, medications).  Folliculitis can be acute or chronic and recommended treatment depends on the underlying cause of folliculitis.  Treatment Plan: - Rx Adapalene 0.1% cream - apply every other night.   FOLLICULITIS   KELOID Right Submental Area Procedure Note Intralesional Injection  Location: chin  Informed Consent: Discussed risks (infection, pain, bleeding, bruising, thinning of the skin, loss of skin pigment, lack of resolution, and recurrence of lesion) and benefits of the procedure, as well as the alternatives. Informed consent was obtained. Preparation: The area was prepared a standard fashion.  Anesthesia:n/a  Procedure Details: An intralesional injection was performed with Kenalog 40 mg/cc. 0.1 cc in total were injected. 0.5cc injected into each bump. NDC #: 0981-1914-78 Exp: 03/2025  Total number of injections: 2  Plan: The patient was instructed on post-op care. Recommend OTC analgesia as needed for pain. Related Medications adapalene (DIFFERIN) 0.1 % cream Apply to affected areas every other night.  No follow-ups on file.    Documentation: I have reviewed the above documentation for accuracy and completeness, and I agree with the above.  I, Shirron Marcha Solders, CMA, am acting as scribe for [employer], DO.   Langston Reusing, DO

## 2024-01-29 DIAGNOSIS — I129 Hypertensive chronic kidney disease with stage 1 through stage 4 chronic kidney disease, or unspecified chronic kidney disease: Secondary | ICD-10-CM | POA: Diagnosis not present

## 2024-01-29 DIAGNOSIS — N183 Chronic kidney disease, stage 3 unspecified: Secondary | ICD-10-CM | POA: Diagnosis not present

## 2024-01-29 DIAGNOSIS — R9431 Abnormal electrocardiogram [ECG] [EKG]: Secondary | ICD-10-CM | POA: Diagnosis not present

## 2024-01-29 DIAGNOSIS — G43909 Migraine, unspecified, not intractable, without status migrainosus: Secondary | ICD-10-CM | POA: Diagnosis not present

## 2024-01-29 DIAGNOSIS — Z4823 Encounter for aftercare following liver transplant: Secondary | ICD-10-CM | POA: Diagnosis not present

## 2024-01-29 DIAGNOSIS — Z941 Heart transplant status: Secondary | ICD-10-CM | POA: Diagnosis not present

## 2024-01-29 DIAGNOSIS — T86290 Cardiac allograft vasculopathy: Secondary | ICD-10-CM | POA: Diagnosis not present

## 2024-01-29 DIAGNOSIS — R0602 Shortness of breath: Secondary | ICD-10-CM | POA: Diagnosis not present

## 2024-01-29 DIAGNOSIS — E785 Hyperlipidemia, unspecified: Secondary | ICD-10-CM | POA: Diagnosis not present

## 2024-01-29 DIAGNOSIS — E1122 Type 2 diabetes mellitus with diabetic chronic kidney disease: Secondary | ICD-10-CM | POA: Diagnosis not present

## 2024-01-29 DIAGNOSIS — Z944 Liver transplant status: Secondary | ICD-10-CM | POA: Diagnosis not present

## 2024-01-29 DIAGNOSIS — E119 Type 2 diabetes mellitus without complications: Secondary | ICD-10-CM | POA: Diagnosis not present

## 2024-04-23 DIAGNOSIS — Z941 Heart transplant status: Secondary | ICD-10-CM | POA: Diagnosis not present

## 2024-04-23 DIAGNOSIS — Z79621 Long term (current) use of calcineurin inhibitor: Secondary | ICD-10-CM | POA: Diagnosis not present

## 2024-04-23 DIAGNOSIS — K432 Incisional hernia without obstruction or gangrene: Secondary | ICD-10-CM | POA: Diagnosis not present

## 2024-04-23 DIAGNOSIS — Z944 Liver transplant status: Secondary | ICD-10-CM | POA: Diagnosis not present

## 2024-04-28 DIAGNOSIS — Z941 Heart transplant status: Secondary | ICD-10-CM | POA: Diagnosis not present

## 2024-06-12 ENCOUNTER — Telehealth: Payer: Self-pay

## 2024-06-12 NOTE — Telephone Encounter (Signed)
 Pt is not diabetic.

## 2024-06-26 ENCOUNTER — Other Ambulatory Visit: Payer: Self-pay | Admitting: Internal Medicine

## 2024-06-26 DIAGNOSIS — E785 Hyperlipidemia, unspecified: Secondary | ICD-10-CM

## 2024-06-27 ENCOUNTER — Other Ambulatory Visit: Payer: Self-pay | Admitting: Internal Medicine

## 2024-06-29 ENCOUNTER — Ambulatory Visit (INDEPENDENT_AMBULATORY_CARE_PROVIDER_SITE_OTHER): Payer: Medicare Other

## 2024-06-29 VITALS — Ht 69.0 in | Wt 178.0 lb

## 2024-06-29 DIAGNOSIS — Z0000 Encounter for general adult medical examination without abnormal findings: Secondary | ICD-10-CM

## 2024-06-29 NOTE — Patient Instructions (Addendum)
 [name] , Thank you for taking time out of your busy schedule to complete your Annual Wellness Visit with me. I enjoyed our conversation and look forward to speaking with you again next year. I, as well as your care team,  appreciate your ongoing commitment to your health goals. Please review the following plan we discussed and let me know if I can assist you in the future. Your Game plan/ To Do List    Referrals: If you haven't heard from the office you've been referred to, please reach out to them at the phone provided.   Follow up Visits: We will see or speak with you next year for your Next Medicare AWV with our clinical staff Have you seen your provider in the last 6 months (3 months if uncontrolled diabetes)? No  Clinician Recommendations:  Aim for 30 minutes of exercise or brisk walking, 6-8 glasses of water, and 5 servings of fruits and vegetables each day. Educated and advised on getting the Hepatitis B and Shingles vaccines in 2025.      This is a list of the screenings recommended for you:  Health Maintenance  Topic Date Due   Hepatitis B Vaccine (1 of 3 - 19+ 3-dose series) Never done   Zoster (Shingles) Vaccine (1 of 2) Never done   COVID-19 Vaccine (5 - 2024-25 season) 07/14/2023   Flu Shot  06/12/2024   Cologuard (Stool DNA test)  07/06/2024   Medicare Annual Wellness Visit  06/29/2025   Pneumococcal Vaccine for age over 10 (3 of 3 - PCV20 or PCV21) 12/24/2028   DTaP/Tdap/Td vaccine (2 - Td or Tdap) 06/16/2031   Hepatitis C Screening  Completed   HIV Screening  Completed   HPV Vaccine  Aged Out   Meningitis B Vaccine  Aged Out   Pneumococcal Vaccine  Discontinued    Advanced directives: (Provided) Advance directive discussed with you today. I have provided a copy for you to complete at home and have notarized. Once this is complete, please bring a copy in to our office so we can scan it into your chart.  Advance Care Planning is important because it:  [x]  Makes sure  you receive the medical care that is consistent with your values, goals, and preferences  [x]  It provides guidance to your family and loved ones and reduces their decisional burden about whether or not they are making the right decisions based on your wishes.  Follow the link provided in your after visit summary or read over the paperwork we have mailed to you to help you started getting your Advance Directives in place. If you need assistance in completing these, please reach out to us  so that we can help you!

## 2024-06-29 NOTE — Progress Notes (Signed)
 Subjective:   Jason Ware is a 58 y.o. who presents for a Medicare Wellness preventive visit.  As a reminder, Annual Wellness Visits don't include a physical exam, and some assessments may be limited, especially if this visit is performed virtually. We may recommend an in-person follow-up visit with your provider if needed.  Visit Complete: Virtual I connected with  DANG MATHISON on 06/29/24 by a audio enabled telemedicine application and verified that I am speaking with the correct person using two identifiers.  Patient Location: Home  Provider Location: Office/Clinic  I discussed the limitations of evaluation and management by telemedicine. The patient expressed understanding and agreed to proceed.  Vital Signs: Because this visit was a virtual/telehealth visit, some criteria may be missing or patient reported. Any vitals not documented were not able to be obtained and vitals that have been documented are patient reported.  VideoDeclined- This patient declined Librarian, academic. Therefore the visit was completed with audio only.  Persons Participating in Visit: Patient.  AWV Questionnaire: No: Patient Medicare AWV questionnaire was not completed prior to this visit.  Cardiac Risk Factors include: advanced age (>87men, >43 women);dyslipidemia;hypertension;male gender     Objective:    Today's Vitals   06/29/24 1512  Weight: 178 lb (80.7 kg)  Height: 5' 9 (1.753 m)   Body mass index is 26.29 kg/m.     06/29/2024    3:12 PM 06/26/2023    3:19 PM 07/20/2017    1:44 PM  Advanced Directives  Does Patient Have a Medical Advance Directive? No No No   Would patient like information on creating a medical advance directive? Yes (MAU/Ambulatory/Procedural Areas - Information given) No - Patient declined      Data saved with a previous flowsheet row definition    Current Medications (verified) Outpatient Encounter Medications as of 06/29/2024   Medication Sig   adapalene  (DIFFERIN ) 0.1 % cream Apply to affected areas every other night.   amitriptyline  (ELAVIL ) 25 MG tablet Take 1 tablet (25 mg total) by mouth at bedtime.   aspirin EC 81 MG tablet Take 81 mg by mouth every morning.   Atogepant  (QULIPTA ) 60 MG TABS Take 1 tablet by mouth daily.   atorvastatin  (LIPITOR) 20 MG tablet Take 1 tablet (20 mg total) by mouth daily.   citalopram (CELEXA) 20 MG tablet Take 30 mg by mouth daily.    cycloSPORINE  modified (NEORAL ) 100 MG capsule Take 100 mg by mouth daily before breakfast.   cycloSPORINE  modified (NEORAL ) 25 MG capsule Take 75 mg by mouth at bedtime.   diltiazem (DILACOR XR) 180 MG 24 hr capsule Take 180 mg by mouth daily.   magnesium oxide (MAG-OX) 400 MG tablet Take 800 mg by mouth daily.   melatonin 3 MG TABS tablet Take 3 mg by mouth at bedtime as needed (prn for sleep).   mycophenolate  (CELLCEPT ) 500 MG tablet Take 1,000 mg by mouth 2 (two) times daily.   pantoprazole (PROTONIX) 40 MG tablet Take 40 mg by mouth daily.   tadalafil  (CIALIS ) 20 MG tablet Take 0.5-1 tablets (10-20 mg total) by mouth every other day as needed for erectile dysfunction.   No facility-administered encounter medications on file as of 06/29/2024.    Allergies (verified) Patient has no known allergies.   History: Past Medical History:  Diagnosis Date   Amyloidosis (HCC)    Anxiety    CHF (congestive heart failure) (HCC)    Depression    ICD (implantable cardioverter-defibrillator) in  place    Migraine    Past Surgical History:  Procedure Laterality Date   HEART TRANSPLANT  2013   HERNIA REPAIR     LIVER TRANSPLANT  2013   Family History  Problem Relation Age of Onset   Heart disease Mother    Healthy Father    Social History   Socioeconomic History   Marital status: Divorced    Spouse name: Not on file   Number of children: 1   Years of education: college   Highest education level: 12th grade  Occupational History    Occupation: disability  Tobacco Use   Smoking status: Never   Smokeless tobacco: Never  Vaping Use   Vaping status: Never Used  Substance and Sexual Activity   Alcohol use: No   Drug use: No   Sexual activity: Yes    Partners: Female  Other Topics Concern   Not on file  Social History Narrative   Right-handed.   Rare use of caffeine .   Lives at home alone.   Social Drivers of Corporate investment banker Strain: Low Risk  (06/29/2024)   Overall Financial Resource Strain (CARDIA)    Difficulty of Paying Living Expenses: Not hard at all  Food Insecurity: No Food Insecurity (06/29/2024)   Hunger Vital Sign    Worried About Running Out of Food in the Last Year: Never true    Ran Out of Food in the Last Year: Never true  Transportation Needs: No Transportation Needs (06/29/2024)   PRAPARE - Administrator, Civil Service (Medical): No    Lack of Transportation (Non-Medical): No  Physical Activity: Inactive (06/29/2024)   Exercise Vital Sign    Days of Exercise per Week: 0 days    Minutes of Exercise per Session: 0 min  Stress: No Stress Concern Present (06/29/2024)   [employer] of Occupational Health - Occupational Stress Questionnaire    Feeling of Stress: Not at all  Social Connections: Socially Isolated (06/29/2024)   Social Connection and Isolation Panel    Frequency of Communication with Friends and Family: More than three times a week    Frequency of Social Gatherings with Friends and Family: Three times a week    Attends Religious Services: Never    Active Member of Clubs or Organizations: No    Attends Banker Meetings: Never    Marital Status: Never married    Tobacco Counseling Counseling given: No    Clinical Intake:  Pre-visit preparation completed: Yes  Pain : No/denies pain     BMI - recorded: 26.29 Nutritional Status: BMI 25 -29 Overweight Nutritional Risks: None Diabetes: No  No results found for: HGBA1C   How  often do you need to have someone help you when you read instructions, pamphlets, or other written materials from your doctor or pharmacy?: 1 - Never  Interpreter Needed?: No  Information entered by :: Verdie Saba, CMA   Activities of Daily Living     06/29/2024    3:15 PM  In your present state of health, do you have any difficulty performing the following activities:  Hearing? 0  Vision? 0  Difficulty concentrating or making decisions? 0  Walking or climbing stairs? 0  Dressing or bathing? 0  Doing errands, shopping? 0  Preparing Food and eating ? N  Using the Toilet? N  In the past six months, have you accidently leaked urine? N  Do you have problems with loss of bowel control? N  Managing your Medications? N  Managing your Finances? N  Housekeeping or managing your Housekeeping? N    Patient Care Team: Joshua Debby CROME, MD as PCP - General (Internal Medicine) [facility] as Consulting Physician (Optometry)  I have updated your Care Teams any recent Medical Services you may have received from other providers in the past year.     Assessment:   This is a routine wellness examination for Hal.  Hearing/Vision screen Hearing Screening - Comments:: Denies hearing difficulties   Vision Screening - Comments:: Denies vision concerns - has seen an Optometrist in 2025   Goals Addressed               This Visit's Progress     Patient Stated (pt-stated)        Patient stated he plans to continue to monitor diet with heart condition       Depression Screen     06/29/2024    3:15 PM 06/26/2023    3:23 PM 06/20/2023   10:37 AM 01/16/2023    3:44 PM 08/30/2022    3:57 PM 06/15/2021    8:54 AM 04/10/2018    3:22 PM  PHQ 2/9 Scores  PHQ - 2 Score 0 0 0 0 0 0 0  PHQ- 9 Score 1 1   0      Fall Risk     06/29/2024    3:15 PM 06/26/2023    3:20 PM 06/20/2023   10:37 AM 01/16/2023    3:43 PM 04/10/2018    3:22 PM  Fall Risk   Falls in the past  year? 0 0 0 0 No   Number falls in past yr: 0 0  0   Injury with Fall? 0 0  0   Risk for fall due to : No Fall Risks No Fall Risks  No Fall Risks   Follow up Falls evaluation completed;Falls prevention discussed Falls prevention discussed  Falls evaluation completed      Data saved with a previous flowsheet row definition    MEDICARE RISK AT HOME:  Medicare Risk at Home Any stairs in or around the home?: Yes If so, are there any without handrails?: No Home free of loose throw rugs in walkways, pet beds, electrical cords, etc?: Yes Adequate lighting in your home to reduce risk of falls?: Yes Life alert?: No Use of a cane, walker or w/c?: No Grab bars in the bathroom?: Yes Shower chair or bench in shower?: No Elevated toilet seat or a handicapped toilet?: No  TIMED UP AND GO:  Was the test performed?  No  Cognitive Function: 6CIT completed        06/29/2024    3:19 PM 06/26/2023    3:23 PM  6CIT Screen  What Year? 0 points 0 points  What month? 0 points 0 points  What time? 0 points 0 points  Count back from 20 0 points 0 points  Months in reverse 0 points 0 points  Repeat phrase 0 points 0 points  Total Score 0 points 0 points    Immunizations Immunization History  Administered Date(s) Administered   Influenza, Seasonal, Injecte, Preservative Fre 12/30/2023   Influenza,inj,Quad PF,6+ Mos 08/05/2017   Influenza-Unspecified 12/18/2022   Moderna Covid-19 Fall Seasonal Vaccine 48yrs & older 12/18/2022   PFIZER(Purple Top)SARS-COV-2 Vaccination 02/05/2020, 02/26/2020, 11/24/2020   Pneumococcal Conjugate-13 08/03/2015   Pneumococcal Polysaccharide-23 12/25/2023   Tdap 06/15/2021    Screening Tests Health Maintenance  Topic Date  Due   Hepatitis B Vaccines 19-59 Average Risk (1 of 3 - 19+ 3-dose series) Never done   Zoster Vaccines- Shingrix (1 of 2) Never done   COVID-19 Vaccine (5 - 2024-25 season) 07/14/2023   INFLUENZA VACCINE  06/12/2024   Fecal DNA (Cologuard)   07/06/2024   Medicare Annual Wellness (AWV)  06/29/2025   Pneumococcal Vaccine: 50+ Years (3 of 3 - PCV20 or PCV21) 12/24/2028   DTaP/Tdap/Td (2 - Td or Tdap) 06/16/2031   Hepatitis C Screening  Completed   HIV Screening  Completed   HPV VACCINES  Aged Out   Meningococcal B Vaccine  Aged Out   Pneumococcal Vaccine  Discontinued    Health Maintenance  Health Maintenance Due  Topic Date Due   Hepatitis B Vaccines 19-59 Average Risk (1 of 3 - 19+ 3-dose series) Never done   Zoster Vaccines- Shingrix (1 of 2) Never done   COVID-19 Vaccine (5 - 2024-25 season) 07/14/2023   INFLUENZA VACCINE  06/12/2024   Health Maintenance Items Addressed: 06/29/2024   Additional Screening:  Vision Screening: Recommended annual ophthalmology exams for early detection of glaucoma and other disorders of the eye. Would you like a referral to an eye doctor? No  Patient states has had an eye exam in early 2025 with an Optometrist (unable to recall name).  Dental Screening: Recommended annual dental exams for proper oral hygiene  Community Resource Referral / Chronic Care Management: CRR required this visit?  No   CCM required this visit?  No   Plan:    I have personally reviewed and noted the following in the patient's chart:   Medical and social history Use of alcohol, tobacco or illicit drugs  Current medications and supplements including opioid prescriptions. Patient is not currently taking opioid prescriptions. Functional ability and status Nutritional status Physical activity Advanced directives List of other physicians Hospitalizations, surgeries, and ER visits in previous 12 months Vitals Screenings to include cognitive, depression, and falls Referrals and appointments  In addition, I have reviewed and discussed with patient certain preventive protocols, quality metrics, and best practice recommendations. A written personalized care plan for preventive services as well as general  preventive health recommendations were provided to patient.   Verdie CHRISTELLA Saba, CMA   06/29/2024   After Visit Summary: (MyChart) Due to this being a telephonic visit, the after visit summary with patients personalized plan was offered to patient via MyChart   Notes: Scheduled a Physical for the pt w/PCP for 10/2024.

## 2024-06-30 ENCOUNTER — Ambulatory Visit: Admitting: Family Medicine

## 2024-06-30 ENCOUNTER — Ambulatory Visit: Payer: Self-pay | Admitting: Family Medicine

## 2024-06-30 VITALS — BP 138/88 | HR 87 | Temp 98.8°F | Ht 69.0 in | Wt 182.2 lb

## 2024-06-30 DIAGNOSIS — N183 Chronic kidney disease, stage 3 unspecified: Secondary | ICD-10-CM | POA: Diagnosis not present

## 2024-06-30 DIAGNOSIS — R0602 Shortness of breath: Secondary | ICD-10-CM | POA: Diagnosis not present

## 2024-06-30 DIAGNOSIS — D849 Immunodeficiency, unspecified: Secondary | ICD-10-CM

## 2024-06-30 DIAGNOSIS — Z9489 Other transplanted organ and tissue status: Secondary | ICD-10-CM | POA: Diagnosis not present

## 2024-06-30 LAB — URINALYSIS, ROUTINE W REFLEX MICROSCOPIC
Bilirubin Urine: NEGATIVE
Hgb urine dipstick: NEGATIVE
Ketones, ur: NEGATIVE
Leukocytes,Ua: NEGATIVE
Nitrite: NEGATIVE
RBC / HPF: NONE SEEN (ref 0–?)
Specific Gravity, Urine: 1.015 (ref 1.000–1.030)
Total Protein, Urine: NEGATIVE
Urine Glucose: NEGATIVE
Urobilinogen, UA: 0.2 (ref 0.0–1.0)
pH: 7.5 (ref 5.0–8.0)

## 2024-06-30 LAB — BRAIN NATRIURETIC PEPTIDE: Pro B Natriuretic peptide (BNP): 83 pg/mL (ref 0.0–100.0)

## 2024-06-30 LAB — CBC WITH DIFFERENTIAL/PLATELET
Basophils Absolute: 0 K/uL (ref 0.0–0.1)
Basophils Relative: 1.2 % (ref 0.0–3.0)
Eosinophils Absolute: 0 K/uL (ref 0.0–0.7)
Eosinophils Relative: 0.7 % (ref 0.0–5.0)
HCT: 39.8 % (ref 39.0–52.0)
Hemoglobin: 12.9 g/dL — ABNORMAL LOW (ref 13.0–17.0)
Lymphocytes Relative: 29.6 % (ref 12.0–46.0)
Lymphs Abs: 0.9 K/uL (ref 0.7–4.0)
MCHC: 32.4 g/dL (ref 30.0–36.0)
MCV: 90.1 fl (ref 78.0–100.0)
Monocytes Absolute: 0.4 K/uL (ref 0.1–1.0)
Monocytes Relative: 11.8 % (ref 3.0–12.0)
Neutro Abs: 1.7 K/uL (ref 1.4–7.7)
Neutrophils Relative %: 56.7 % (ref 43.0–77.0)
Platelets: 226 K/uL (ref 150.0–400.0)
RBC: 4.42 Mil/uL (ref 4.22–5.81)
RDW: 14.2 % (ref 11.5–15.5)
WBC: 3 K/uL — ABNORMAL LOW (ref 4.0–10.5)

## 2024-06-30 LAB — COMPREHENSIVE METABOLIC PANEL WITH GFR
ALT: 17 U/L (ref 0–53)
AST: 28 U/L (ref 0–37)
Albumin: 4.4 g/dL (ref 3.5–5.2)
Alkaline Phosphatase: 62 U/L (ref 39–117)
BUN: 21 mg/dL (ref 6–23)
CO2: 30 meq/L (ref 19–32)
Calcium: 9.7 mg/dL (ref 8.4–10.5)
Chloride: 102 meq/L (ref 96–112)
Creatinine, Ser: 1.54 mg/dL — ABNORMAL HIGH (ref 0.40–1.50)
GFR: 49.67 mL/min — ABNORMAL LOW (ref >60.00)
Glucose, Bld: 85 mg/dL (ref 70–99)
Potassium: 4.4 meq/L (ref 3.5–5.1)
Sodium: 138 meq/L (ref 135–145)
Total Bilirubin: 1.4 mg/dL — ABNORMAL HIGH (ref 0.2–1.2)
Total Protein: 7.4 g/dL (ref 6.0–8.3)

## 2024-06-30 LAB — D-DIMER, QUANTITATIVE: D-Dimer, Quant: 0.21 ug{FEU}/mL (ref <0.50)

## 2024-06-30 LAB — PROTIME-INR
INR: 1.1 ratio — ABNORMAL HIGH (ref 0.8–1.0)
Prothrombin Time: 11.4 s (ref 9.6–13.1)

## 2024-06-30 NOTE — Patient Instructions (Addendum)
 EKG shows no acute changes.   We are checking labs today, will be in contact with any results that require further attention  Follow up with specialists as scheduled, sooner if symptoms are not improving.  Please follow up in the ER for worsening shortness of breath, chest pain, high fever, other concerns.

## 2024-06-30 NOTE — Progress Notes (Signed)
 Acute Office Visit  Subjective:     Patient ID: Jason Ware, male    DOB: September 11, 1966, 58 y.o.   MRN: [id number]  Chief Complaint  Patient presents with   Shortness of Breath    Pt stated that he has CKD and is a heart/liver transplant pt and jas been experiencing some body itch in various places: arms, legs, behind knee cap, back and buttocks for the past 3 weeks with no change    Shortness of Breath    Discussed the use of AI scribe software for clinical note transcription with the patient, who gave verbal consent to proceed.  History of Present Illness Jason Ware is a 58 year old male with heart and liver conditions who presents with shortness of breath and itching.  Dyspnea on exertion - Shortness of breath for the past three weeks - Occurs with exertion such as short walks or moving around the house - No recent illness - No lower extremity swelling; regularly checks ankles without noticing changes - No chest pain - No palpitations  Pruritus - Itching without rash - Primarily located behind knees, on buttocks, and sometimes on both sides of body - Location of itching has shifted over the past week - Persistent rubbing and scratching  Constitutional symptoms - Night sweats present, not new for him   - No recent chills, sweats, or weight loss  Medication use - Takes medications regularly - Occasional use of Tylenol for side effects; last use about a week ago     Review of Systems  Respiratory:  Positive for shortness of breath.    Per HPI      Objective:    BP 138/88   Pulse 87   Temp 98.8 F (37.1 C)   Ht 5' 9 (1.753 m)   Wt 182 lb 3.2 oz (82.6 kg)   SpO2 96%   BMI 26.91 kg/m    Physical Exam Vitals and nursing note reviewed.  Constitutional:      General: He is not in acute distress.    Appearance: Normal appearance.  HENT:     Head: Normocephalic and atraumatic.     Right Ear: External ear normal.     Left Ear: External ear  normal.     Nose: Nose normal.     Mouth/Throat:     Mouth: Mucous membranes are moist.     Pharynx: Oropharynx is clear.  Eyes:     Extraocular Movements: Extraocular movements intact.  Cardiovascular:     Rate and Rhythm: Normal rate and regular rhythm.     Pulses: Normal pulses.     Heart sounds: Normal heart sounds.  Pulmonary:     Effort: Pulmonary effort is normal. No respiratory distress.     Breath sounds: Normal breath sounds. No wheezing, rhonchi or rales.  Abdominal:     Comments: Multiple surgical scars to abd, healed  Musculoskeletal:        General: Normal range of motion.     Cervical back: Normal range of motion.     Right lower leg: No edema.     Left lower leg: No edema.  Lymphadenopathy:     Cervical: No cervical adenopathy.  Skin:    General: Skin is warm and dry.  Neurological:     General: No focal deficit present.     Mental Status: He is alert and oriented to person, place, and time.  Psychiatric:        Mood and Affect:  Mood normal.        Behavior: Behavior normal.   EKG: interpreted by me Indication: SOBOE, heart transplant status Rate: 70 Interpretation: NSR with RBBB Changes from previous: new RBBB   No results found for any visits on 06/30/24.      Assessment & Plan:   Assessment and Plan Assessment & Plan Shortness of breath Shortness of breath for three weeks, worsened by exertion. - Order blood tests for liver and kidney function. - Order enzyme tests for cardiac function. - Order D-dimer test to rule out blood clot. - Order PT INR blood test.  Pruritus Intermittent pruritus without visible rash, primarily behind knees and on buttocks.  Night sweats Reports night sweats without chills or weight loss.  Transplant Status - Liver and heart in 2013 - managed by transplant team in [place], [place]     Orders Placed This Encounter  Procedures   CBC with Differential/Platelet    Release to patient:   Immediate [1]    Comprehensive metabolic panel with GFR    Release to patient:   Immediate [1]   D-dimer, quantitative    Release to patient:   Immediate [1]   B Nat Peptide   Protime-INR    Standing Status:   Future    Expiration Date:   06/30/2025   Urinalysis, Routine w reflex microscopic   EKG 12-Lead     No orders of the defined types were placed in this encounter.   Return if symptoms worsen or fail to improve.  Jason LITTIE Ku, FNP

## 2024-07-21 ENCOUNTER — Ambulatory Visit: Admitting: Dermatology

## 2024-07-21 ENCOUNTER — Encounter: Payer: Self-pay | Admitting: Dermatology

## 2024-07-21 VITALS — BP 131/101

## 2024-07-21 DIAGNOSIS — L910 Hypertrophic scar: Secondary | ICD-10-CM | POA: Diagnosis not present

## 2024-07-21 DIAGNOSIS — L739 Follicular disorder, unspecified: Secondary | ICD-10-CM

## 2024-07-21 MED ORDER — TRIAMCINOLONE ACETONIDE 10 MG/ML IJ SUSP
10.0000 mg | Freq: Once | INTRAMUSCULAR | Status: AC
  Administered 2024-07-21: 10 mg

## 2024-07-21 MED ORDER — TRETINOIN 0.05 % EX CREA: TOPICAL_CREAM | Freq: Every day | CUTANEOUS | 0 refills | Status: AC

## 2024-07-21 NOTE — Progress Notes (Signed)
   Follow-Up Visit   Subjective  Jason Ware is a 58 y.o. male established patient who presents for FOLLOW UP on the diagnoses listed below:  Patient was last evaluated on 01/15/24.   Folliculitis Barbae: Prescribed Adapalene  0.1% cream to apply every other night and administered Kenalog -40 into keloid located on the R submental area. Patient reports sxs are better noting the area has gotten smaller - he does not believe that the topical helped at all as he noticed the most improvement after the ILK injections. Itch/Pain Score 0 out of 10.   The following portions of the chart were reviewed this encounter and updated as appropriate: medications, allergies, medical history  Review of Systems:  No other skin or systemic complaints except as noted in HPI or Assessment and Plan.  Objective  Well appearing patient in no apparent distress; mood and affect are within normal limits.   A focused examination was performed of the following areas: chin   Relevant exam findings are noted in the Assessment and Plan.       Right Submental Area    Assessment & Plan   FOLLICULITIS BARBAE Exam: Perifollicular erythematous papules and pustules   Assessment: Patient reports partial improvement of keloidal growths following previous injection treatment. One larger growth flattened, another completely resolved, while one started to regrow about a week ago. Patient discontinued adapalene  approximately 2 weeks ago due to perceived lack of efficacy, which may have contributed to the recurrence. - Plan:    Administer injection treatment to the recurring keloidal growth with total volume of 0.1 cc    Prescribe tretinoin  0.05% topical lotion for Monday, Wednesday, Friday nights    Apply quarter-size amount of lotion base mixed with pea-size amount of medication to beard area and chin    Continue adapalene  as previously prescribed    Provide samples of Avene Tolerance moisturizer to mix with tretinoin   to improve tolerability    Provide samples of Avene Face Wash    Emphasize importance of consistent use for 4-week increments    Instruct to pause treatment for 1 week if experiencing dryness, itchiness, or irritation, then resume application 2 nights per week    Limit exfoliation to once or twice weekly  KELOID Exam shows Firm pink/brown dermal papule(s)/plaque(s).  Treatment Plan: - Will administer Kenalog -10 injections   KELOID Right Submental Area Procedure Note Intralesional Injection  Location: chin  Informed Consent: Discussed risks (infection, pain, bleeding, bruising, thinning of the skin, loss of skin pigment, lack of resolution, and recurrence of lesion) and benefits of the procedure, as well as the alternatives. Informed consent was obtained. Preparation: The area was prepared a standard fashion.  Anesthesia:n/a  Procedure Details: An intralesional injection was performed with Kenalog  10 mg/cc. 0.1 cc in total were injected. 0.5cc injected into each bump. NDC #:9996-9505-79 Exp: 01/2025  Total number of injections: 1  Plan: The patient was instructed on post-op care. Recommend OTC analgesia as needed for pain. Related Medications triamcinolone  acetonide (KENALOG ) 10 MG/ML injection 10 mg  FOLLICULITIS   Related Medications tretinoin  (RETIN-A ) 0.05 % cream Apply topically at bedtime.  Return in about 6 months (around 01/18/2025), or folliculitis barbae.   Documentation: I have reviewed the above documentation for accuracy and completeness, and I agree with the above.  I, Shirron Maranda, CMA, am acting as scribe for [employer], DO.   Delon Lenis, DO

## 2024-07-21 NOTE — Patient Instructions (Signed)

## 2024-08-04 ENCOUNTER — Ambulatory Visit: Admitting: Dermatology

## 2024-08-06 DIAGNOSIS — T50905A Adverse effect of unspecified drugs, medicaments and biological substances, initial encounter: Secondary | ICD-10-CM | POA: Diagnosis not present

## 2024-08-06 DIAGNOSIS — E785 Hyperlipidemia, unspecified: Secondary | ICD-10-CM | POA: Diagnosis not present

## 2024-08-06 DIAGNOSIS — Z950 Presence of cardiac pacemaker: Secondary | ICD-10-CM | POA: Diagnosis not present

## 2024-08-06 DIAGNOSIS — R002 Palpitations: Secondary | ICD-10-CM | POA: Diagnosis not present

## 2024-08-06 DIAGNOSIS — M818 Other osteoporosis without current pathological fracture: Secondary | ICD-10-CM | POA: Diagnosis not present

## 2024-08-06 DIAGNOSIS — R0602 Shortness of breath: Secondary | ICD-10-CM | POA: Diagnosis not present

## 2024-08-06 DIAGNOSIS — R809 Proteinuria, unspecified: Secondary | ICD-10-CM | POA: Diagnosis not present

## 2024-08-06 DIAGNOSIS — Z941 Heart transplant status: Secondary | ICD-10-CM | POA: Diagnosis not present

## 2024-08-06 DIAGNOSIS — I493 Ventricular premature depolarization: Secondary | ICD-10-CM | POA: Diagnosis not present

## 2024-08-06 DIAGNOSIS — I10 Essential (primary) hypertension: Secondary | ICD-10-CM | POA: Diagnosis not present

## 2024-08-20 DIAGNOSIS — Z941 Heart transplant status: Secondary | ICD-10-CM | POA: Diagnosis not present

## 2024-09-03 DIAGNOSIS — Z941 Heart transplant status: Secondary | ICD-10-CM | POA: Diagnosis not present

## 2024-09-03 DIAGNOSIS — Z4821 Encounter for aftercare following heart transplant: Secondary | ICD-10-CM | POA: Diagnosis not present

## 2024-09-03 DIAGNOSIS — Z944 Liver transplant status: Secondary | ICD-10-CM | POA: Diagnosis not present

## 2024-10-13 ENCOUNTER — Encounter: Admitting: Internal Medicine

## 2025-01-05 ENCOUNTER — Ambulatory Visit: Admitting: Dermatology

## 2025-01-20 ENCOUNTER — Ambulatory Visit: Admitting: Dermatology

## 2025-06-30 ENCOUNTER — Ambulatory Visit
